# Patient Record
Sex: Female | Born: 1985 | Race: Black or African American | Hispanic: No | Marital: Single | Smoking: Never smoker
Health system: Southern US, Community
[De-identification: ages and names within clinical notes are randomized; demographics above are authoritative.]

## PROBLEM LIST (undated history)

## (undated) ENCOUNTER — Inpatient Hospital Stay (HOSPITAL_COMMUNITY): Payer: Self-pay

## (undated) DIAGNOSIS — I10 Essential (primary) hypertension: Secondary | ICD-10-CM

## (undated) DIAGNOSIS — N939 Abnormal uterine and vaginal bleeding, unspecified: Secondary | ICD-10-CM

## (undated) DIAGNOSIS — R51 Headache: Secondary | ICD-10-CM

## (undated) DIAGNOSIS — Z6841 Body Mass Index (BMI) 40.0 and over, adult: Secondary | ICD-10-CM

## (undated) DIAGNOSIS — J40 Bronchitis, not specified as acute or chronic: Secondary | ICD-10-CM

## (undated) HISTORY — PX: TONSILLECTOMY: SUR1361

## (undated) HISTORY — DX: Body Mass Index (BMI) 40.0 and over, adult: Z684

## (undated) HISTORY — PX: KNEE ARTHROSCOPY: SUR90

## (undated) HISTORY — PX: ELBOW SURGERY: SHX618

---
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
## 2006-04-29 ENCOUNTER — Emergency Department (HOSPITAL_COMMUNITY): Admission: EM | Admit: 2006-04-29 | Discharge: 2006-04-29 | Payer: Self-pay | Admitting: Emergency Medicine

## 2006-05-16 ENCOUNTER — Encounter: Admission: RE | Admit: 2006-05-16 | Discharge: 2006-06-22 | Payer: Self-pay | Admitting: Sports Medicine

## 2006-08-28 ENCOUNTER — Emergency Department (HOSPITAL_COMMUNITY): Admission: EM | Admit: 2006-08-28 | Discharge: 2006-08-28 | Payer: Self-pay | Admitting: Emergency Medicine

## 2007-07-15 ENCOUNTER — Emergency Department (HOSPITAL_COMMUNITY): Admission: EM | Admit: 2007-07-15 | Discharge: 2007-07-16 | Payer: Self-pay | Admitting: *Deleted

## 2008-03-25 ENCOUNTER — Emergency Department (HOSPITAL_COMMUNITY): Admission: EM | Admit: 2008-03-25 | Discharge: 2008-03-25 | Payer: Self-pay | Admitting: Emergency Medicine

## 2009-02-26 ENCOUNTER — Inpatient Hospital Stay (HOSPITAL_COMMUNITY): Admission: AD | Admit: 2009-02-26 | Discharge: 2009-02-26 | Payer: Self-pay | Admitting: Obstetrics and Gynecology

## 2009-03-26 ENCOUNTER — Ambulatory Visit: Payer: Self-pay | Admitting: Family Medicine

## 2009-03-26 ENCOUNTER — Inpatient Hospital Stay (HOSPITAL_COMMUNITY): Admission: AD | Admit: 2009-03-26 | Discharge: 2009-03-29 | Payer: Self-pay | Admitting: Family Medicine

## 2009-03-26 ENCOUNTER — Ambulatory Visit: Payer: Self-pay | Admitting: Family

## 2009-03-30 ENCOUNTER — Ambulatory Visit: Payer: Self-pay | Admitting: Family Medicine

## 2009-04-02 ENCOUNTER — Ambulatory Visit: Payer: Self-pay | Admitting: Family Medicine

## 2009-04-06 ENCOUNTER — Ambulatory Visit (HOSPITAL_COMMUNITY): Admission: RE | Admit: 2009-04-06 | Discharge: 2009-04-06 | Payer: Self-pay | Admitting: Obstetrics and Gynecology

## 2009-04-06 ENCOUNTER — Ambulatory Visit: Payer: Self-pay | Admitting: Obstetrics & Gynecology

## 2009-04-09 ENCOUNTER — Ambulatory Visit: Payer: Self-pay | Admitting: Family Medicine

## 2009-04-13 ENCOUNTER — Ambulatory Visit (HOSPITAL_COMMUNITY): Admission: RE | Admit: 2009-04-13 | Discharge: 2009-04-13 | Payer: Self-pay | Admitting: Obstetrics & Gynecology

## 2009-04-13 ENCOUNTER — Ambulatory Visit: Payer: Self-pay | Admitting: Obstetrics & Gynecology

## 2009-04-16 ENCOUNTER — Encounter: Payer: Self-pay | Admitting: Obstetrics & Gynecology

## 2009-04-16 ENCOUNTER — Ambulatory Visit: Payer: Self-pay | Admitting: Family Medicine

## 2009-04-20 ENCOUNTER — Ambulatory Visit: Payer: Self-pay | Admitting: Family Medicine

## 2009-04-20 ENCOUNTER — Ambulatory Visit (HOSPITAL_COMMUNITY): Admission: RE | Admit: 2009-04-20 | Discharge: 2009-04-20 | Payer: Self-pay | Admitting: Family Medicine

## 2009-04-23 ENCOUNTER — Ambulatory Visit (HOSPITAL_COMMUNITY): Admission: RE | Admit: 2009-04-23 | Discharge: 2009-04-23 | Payer: Self-pay | Admitting: Obstetrics & Gynecology

## 2009-04-23 ENCOUNTER — Ambulatory Visit: Payer: Self-pay | Admitting: Family Medicine

## 2009-04-27 ENCOUNTER — Ambulatory Visit (HOSPITAL_COMMUNITY): Admission: RE | Admit: 2009-04-27 | Discharge: 2009-04-27 | Payer: Self-pay | Admitting: Family Medicine

## 2009-04-27 ENCOUNTER — Ambulatory Visit: Payer: Self-pay | Admitting: Obstetrics & Gynecology

## 2009-04-30 ENCOUNTER — Inpatient Hospital Stay (HOSPITAL_COMMUNITY): Admission: AD | Admit: 2009-04-30 | Discharge: 2009-04-30 | Payer: Self-pay | Admitting: Obstetrics & Gynecology

## 2009-04-30 ENCOUNTER — Ambulatory Visit: Payer: Self-pay | Admitting: Obstetrics & Gynecology

## 2009-05-04 ENCOUNTER — Ambulatory Visit (HOSPITAL_COMMUNITY): Admission: RE | Admit: 2009-05-04 | Discharge: 2009-05-04 | Payer: Self-pay | Admitting: Obstetrics & Gynecology

## 2009-05-04 ENCOUNTER — Encounter: Payer: Self-pay | Admitting: Family Medicine

## 2009-05-04 ENCOUNTER — Ambulatory Visit: Payer: Self-pay | Admitting: Obstetrics & Gynecology

## 2009-05-07 ENCOUNTER — Ambulatory Visit: Payer: Self-pay | Admitting: Family Medicine

## 2009-05-08 ENCOUNTER — Inpatient Hospital Stay (HOSPITAL_COMMUNITY): Admission: AD | Admit: 2009-05-08 | Discharge: 2009-05-10 | Payer: Self-pay | Admitting: Obstetrics & Gynecology

## 2009-05-08 ENCOUNTER — Ambulatory Visit: Payer: Self-pay | Admitting: Family

## 2009-06-26 ENCOUNTER — Inpatient Hospital Stay (HOSPITAL_COMMUNITY): Admission: AD | Admit: 2009-06-26 | Discharge: 2009-06-27 | Payer: Self-pay | Admitting: Obstetrics & Gynecology

## 2009-07-21 ENCOUNTER — Emergency Department (HOSPITAL_COMMUNITY): Admission: EM | Admit: 2009-07-21 | Discharge: 2009-07-21 | Payer: Self-pay | Admitting: Emergency Medicine

## 2010-01-08 IMAGING — US US FETAL BPP W/O NONSTRESS
1 series · 14 of 21 positions shown · non-contrast
Comparison: none

OBSTETRICAL ULTRASOUND:
 This ultrasound exam was performed in the [HOSPITAL] Ultrasound Department.  The OB US report was generated in the AS system, and faxed to the ordering physician.  This report is also available in [REDACTED] PACS.

[Series 1: us fetal bpp w/o nonstress · non-contrast · 14 of 21 slices shown]
[im 1/21]
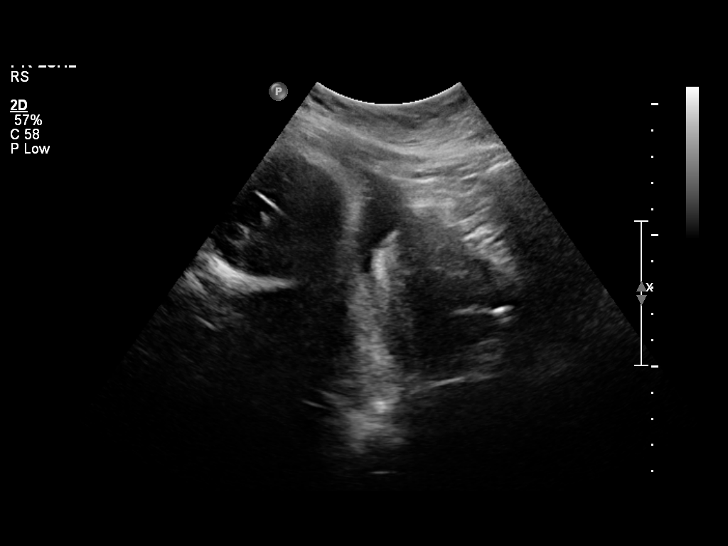
[im 3/21]
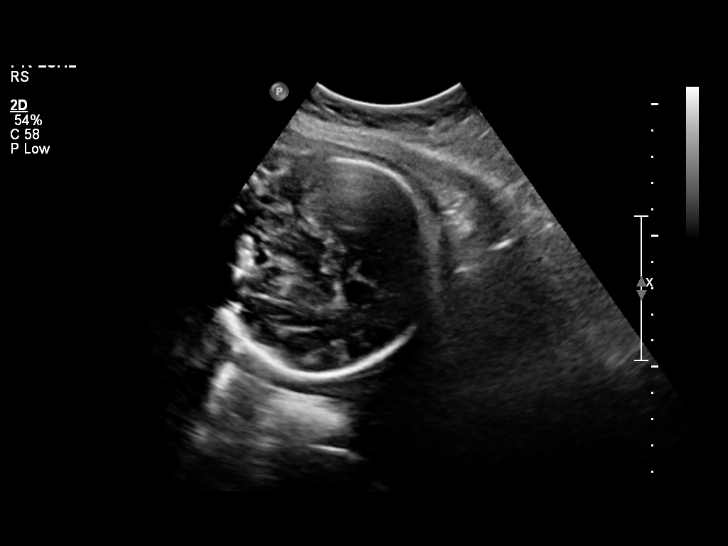
[im 4/21]
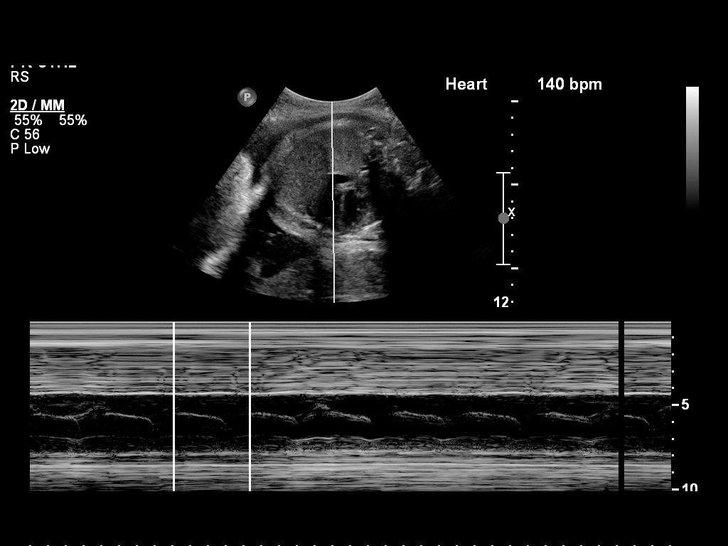
[im 6/21]
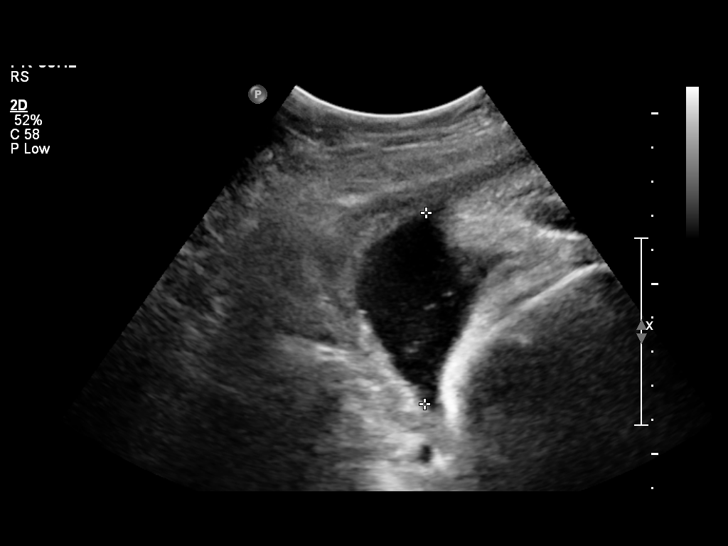
[im 7/21]
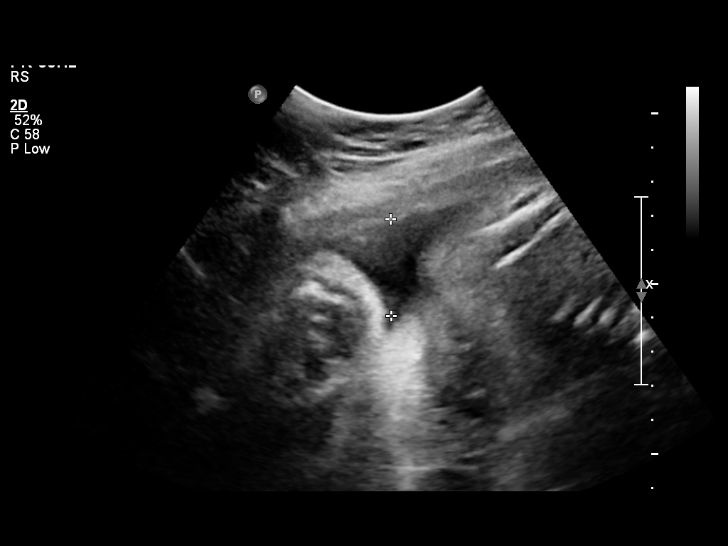
[im 9/21]
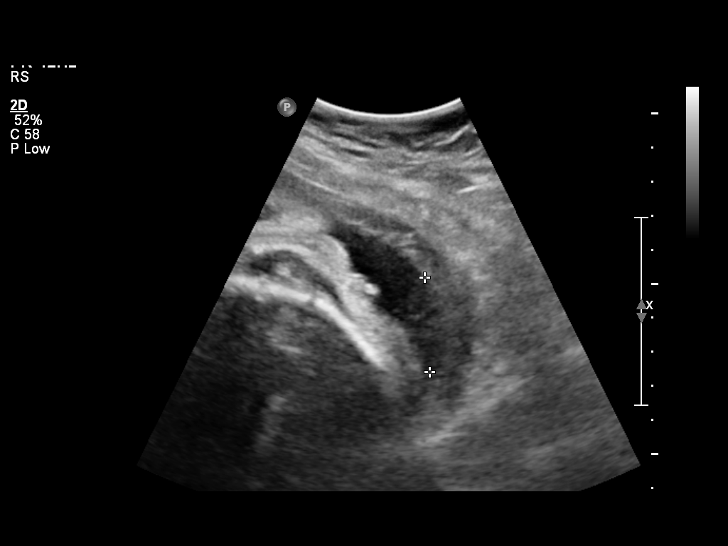
[im 10/21]
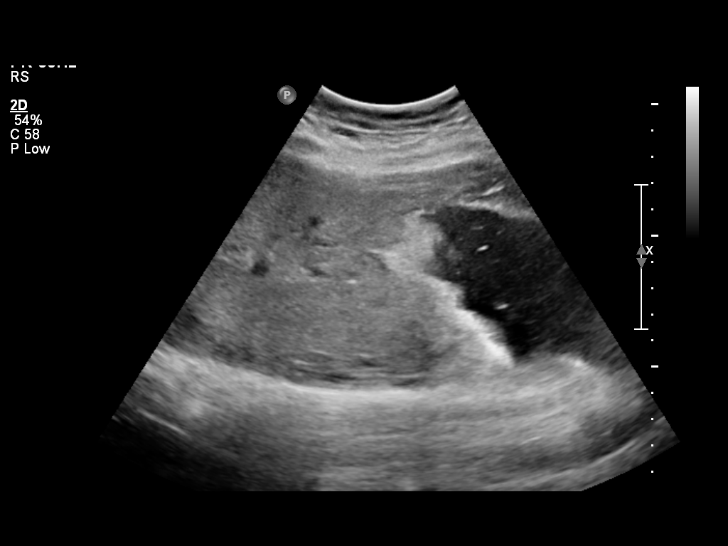
[im 12/21]
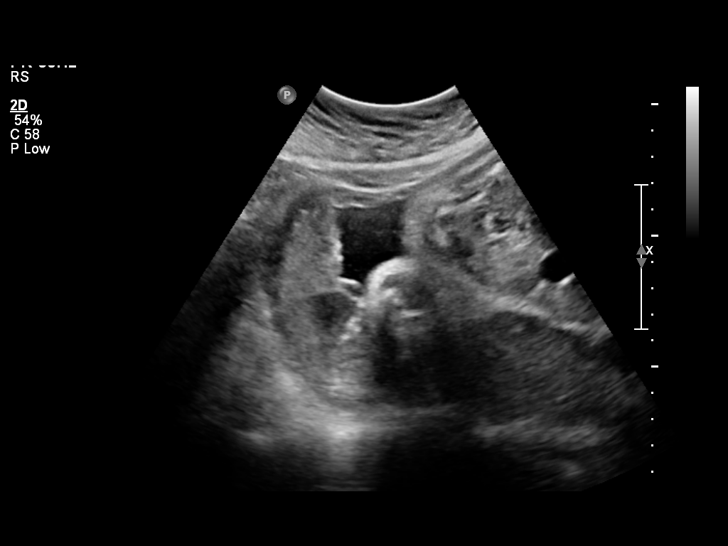
[im 13/21]
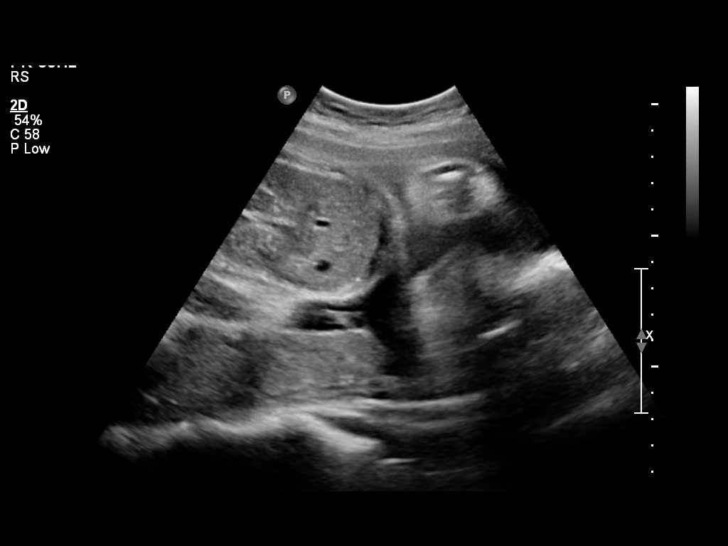
[im 15/21]
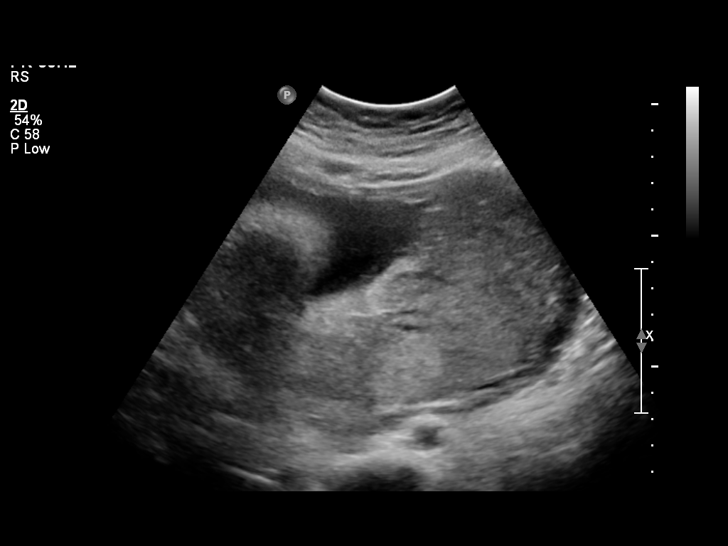
[im 16/21]
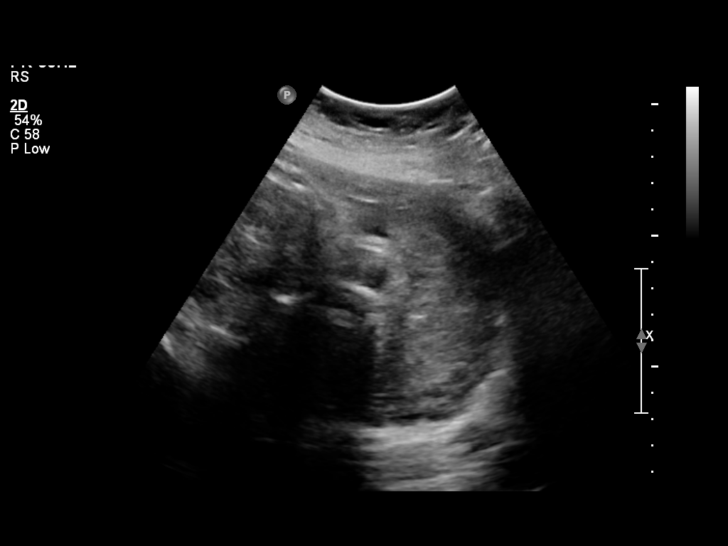
[im 18/21]
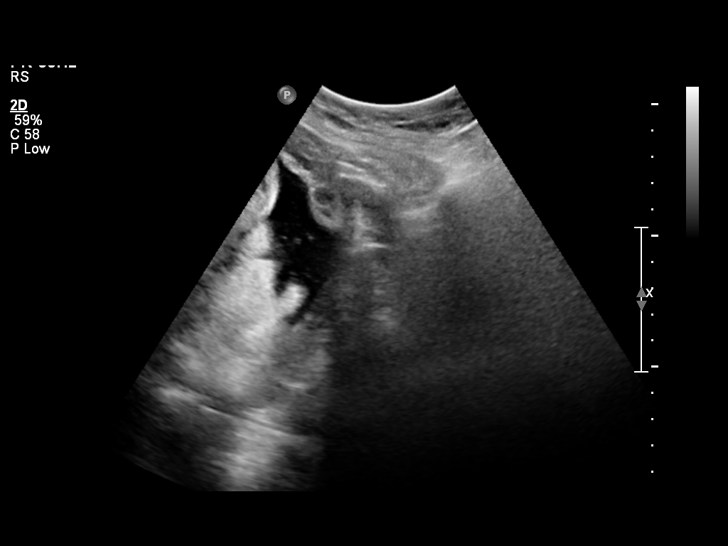
[im 19/21]
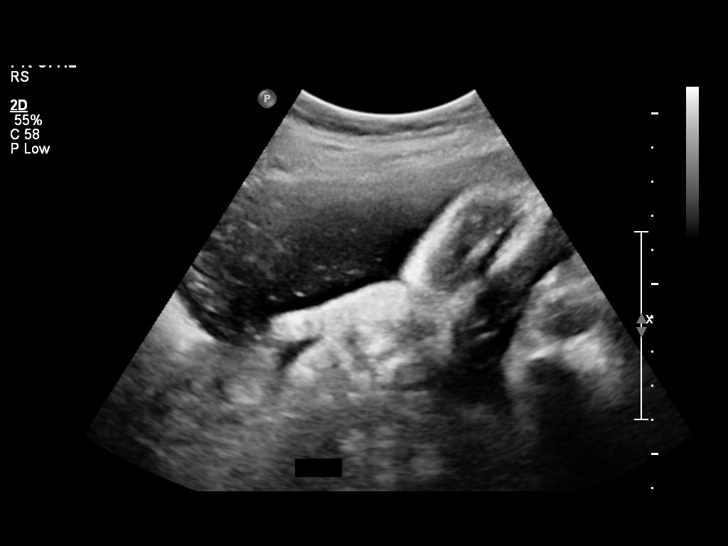
[im 21/21]
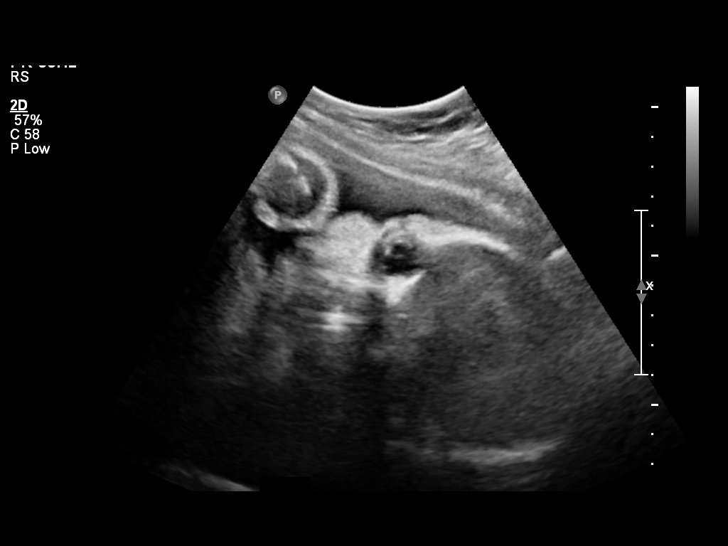

[14 of 21 positions shown; findings below may reference images not displayed]

IMPRESSION: See AS Obstetric US report.

## 2010-01-15 IMAGING — US US OB LIMITED
1 series · 14 of 18 positions shown · non-contrast
Comparison: none

OBSTETRICAL ULTRASOUND:
 This ultrasound exam was performed in the [HOSPITAL] Ultrasound Department.  The OB US report was generated in the AS system, and faxed to the ordering physician.  This report is also available in [REDACTED] PACS.

[Series 1: us fetal bpp w/o nonstress · non-contrast · 18 acquisitions, 14 frames shown]
[im 1/18]
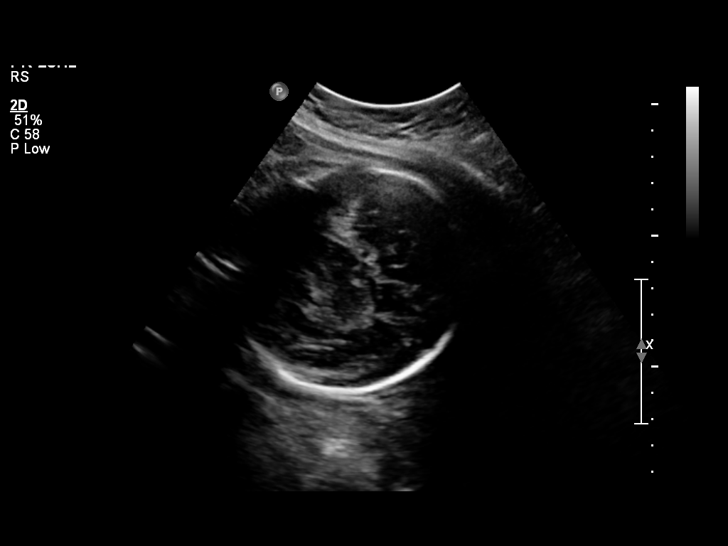
[im 2/18]
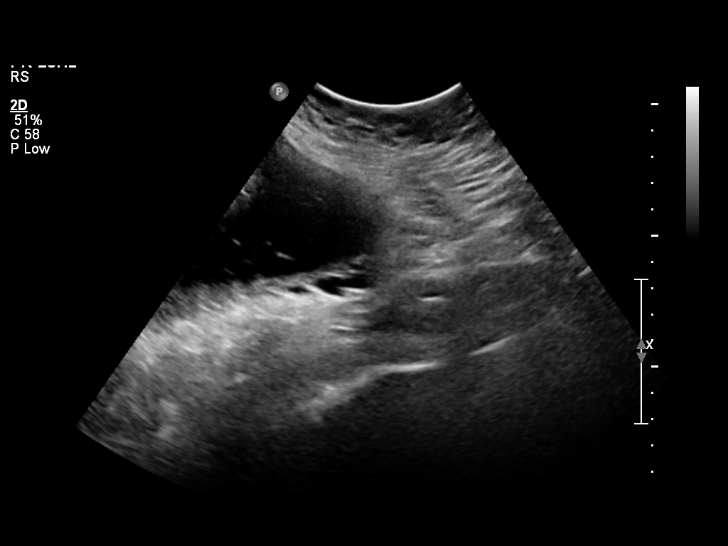
[im 4/18]
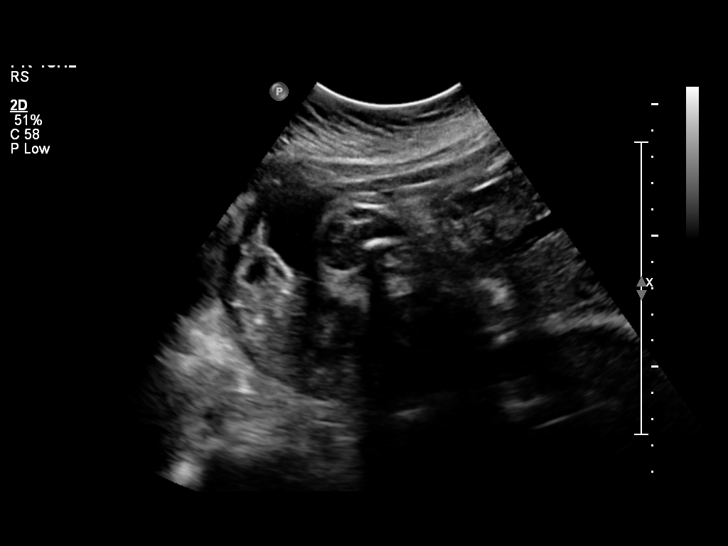
[im 5/18]
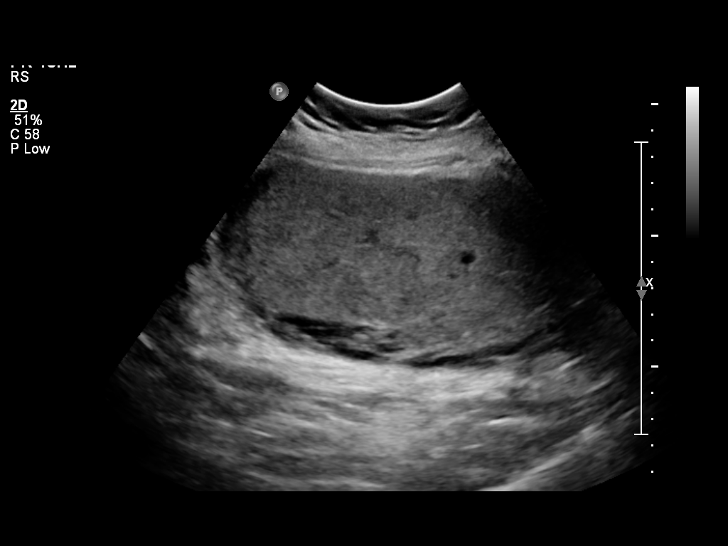
[im 6/18]
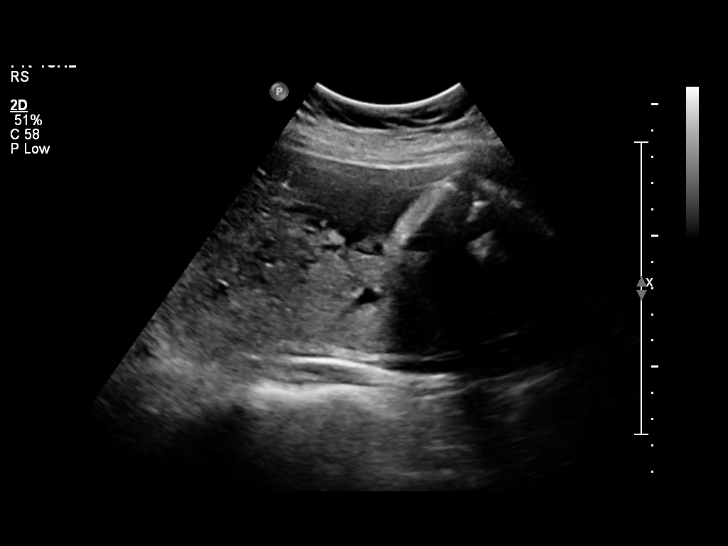
[im 8/18]
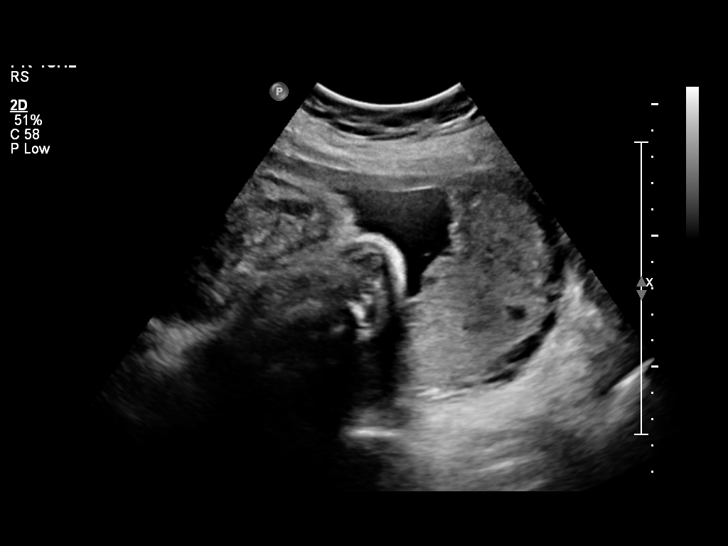
[im 9/18]
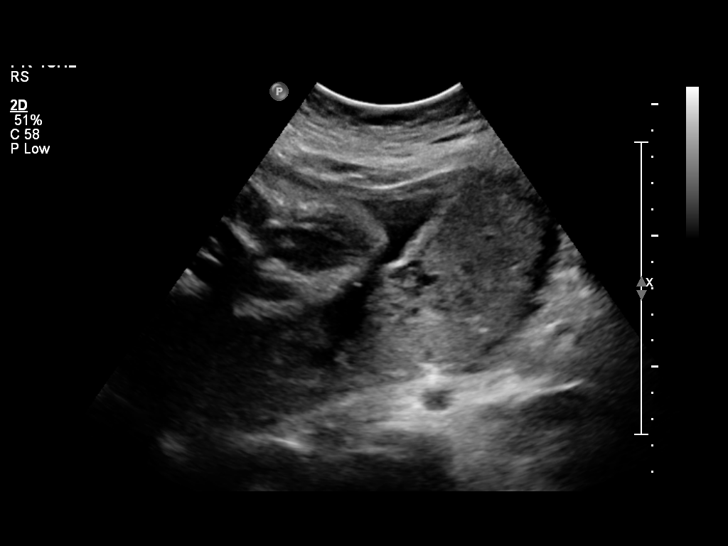
[im 10/18]
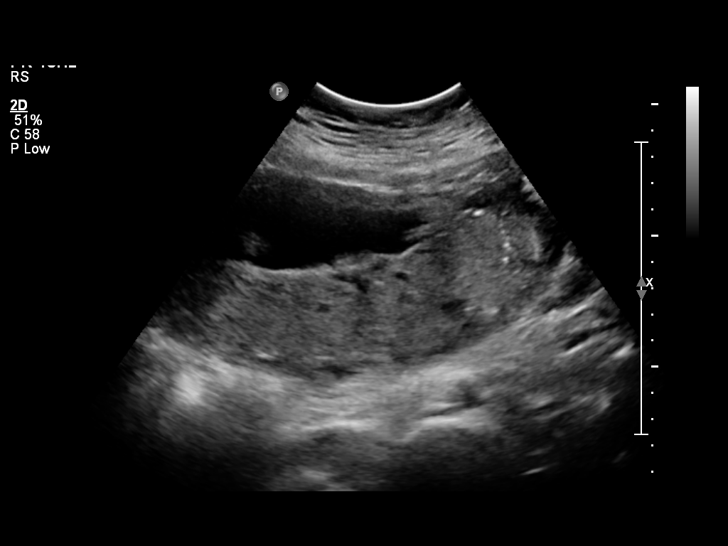
[im 11/18]
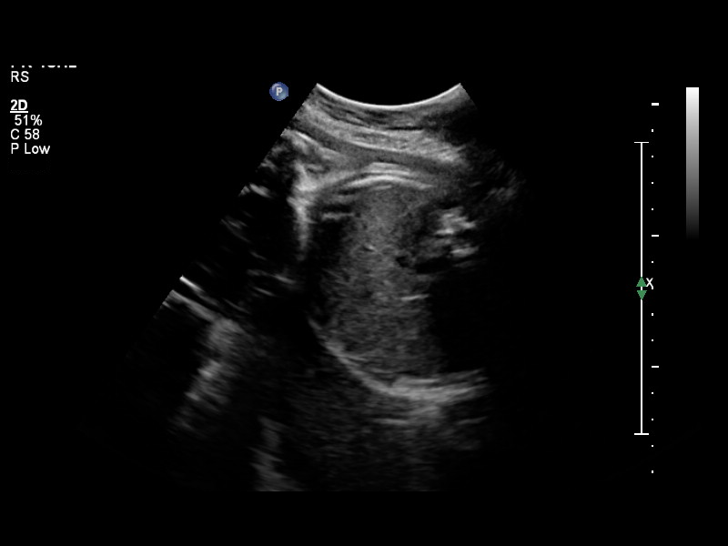
[im 13/18]
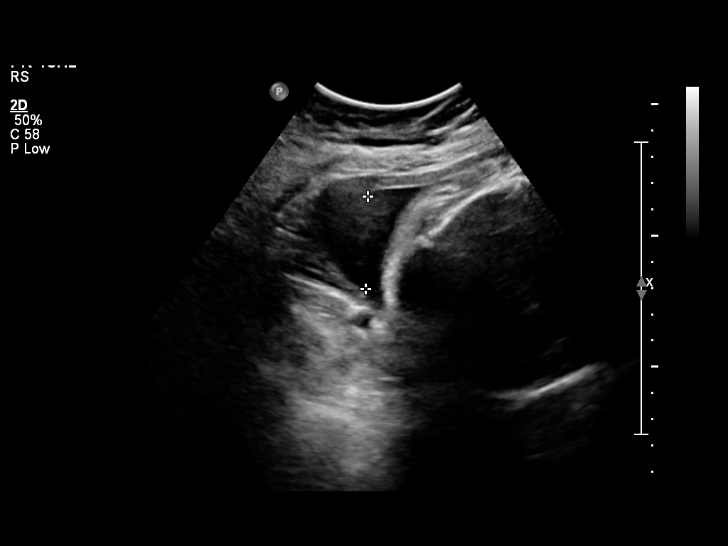
[im 14/18]
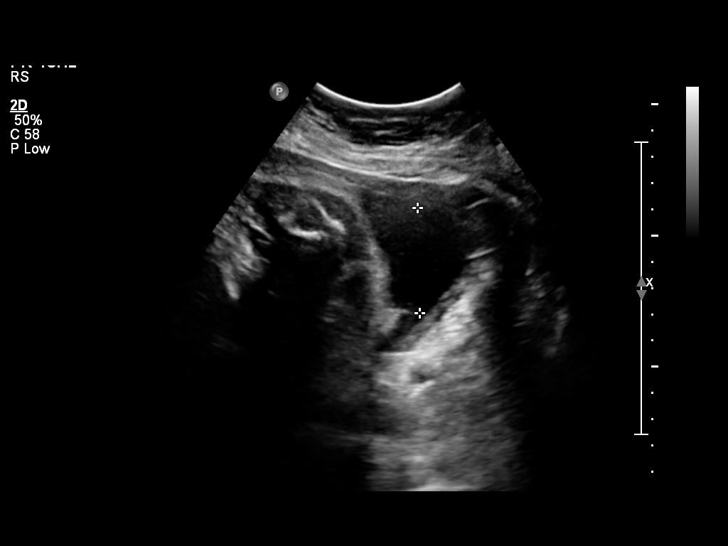
[im 15/18]
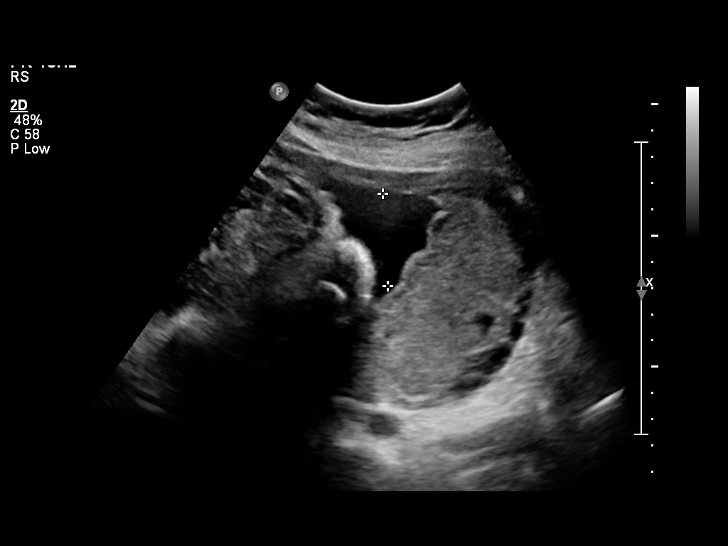
[im 17/18]
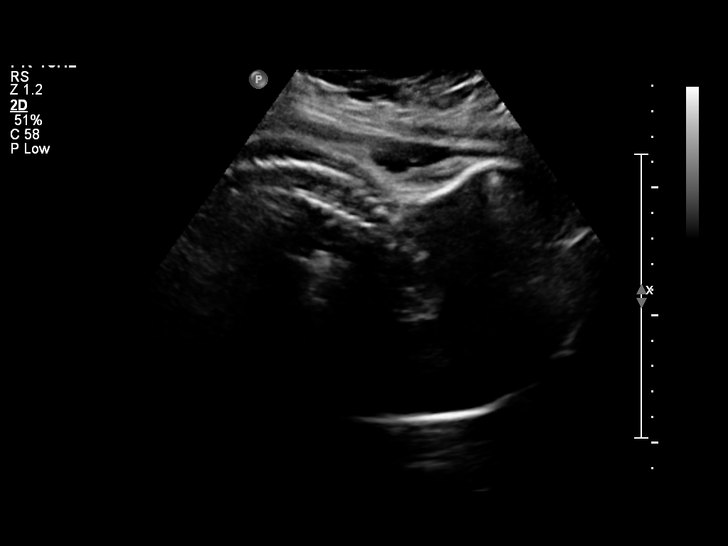
[im 18/18]
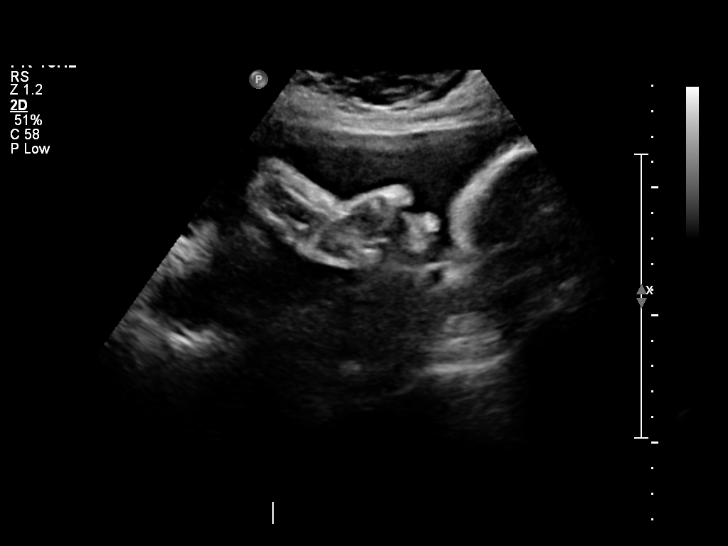

[14 of 18 positions shown; findings below may reference images not displayed]

IMPRESSION: See AS Obstetric US report.

## 2011-01-02 ENCOUNTER — Encounter: Payer: Self-pay | Admitting: *Deleted

## 2011-03-19 LAB — BASIC METABOLIC PANEL
Calcium: 9.5 mg/dL (ref 8.4–10.5)
GFR calc non Af Amer: >60 mL/min (ref >60)
Potassium: 3.9 mEq/L (ref 3.5–5.1)
Sodium: 137 mEq/L (ref 135–145)

## 2011-03-19 LAB — CBC
HCT: 37.8 % (ref 36.0–46.0)
Hemoglobin: 12.2 g/dL (ref 12.0–15.0)
Platelets: 194 10*3/uL (ref 150–400)
RDW: 13.9 % (ref 11.5–15.5)
WBC: 3.8 10*3/uL — ABNORMAL LOW (ref 4.0–10.5)

## 2011-03-19 LAB — URINE MICROSCOPIC-ADD ON

## 2011-03-19 LAB — URINALYSIS, ROUTINE W REFLEX MICROSCOPIC
Glucose, UA: NEGATIVE mg/dL
Hgb urine dipstick: NEGATIVE
Protein, ur: NEGATIVE mg/dL
Specific Gravity, Urine: 1.025 (ref 1.005–1.030)
Urobilinogen, UA: 0.2 mg/dL (ref 0.0–1.0)

## 2011-03-20 LAB — URINALYSIS, ROUTINE W REFLEX MICROSCOPIC
Bilirubin Urine: NEGATIVE
Glucose, UA: NEGATIVE mg/dL
Ketones, ur: 15 mg/dL — AB
Specific Gravity, Urine: >1.03 — ABNORMAL HIGH (ref 1.005–1.030)
pH: 5.5 (ref 5.0–8.0)

## 2011-03-20 LAB — COMPREHENSIVE METABOLIC PANEL
ALT: 12 U/L (ref 0–35)
AST: 16 U/L (ref 0–37)
Alkaline Phosphatase: 62 U/L (ref 39–117)
CO2: 25 mEq/L (ref 19–32)
Calcium: 9 mg/dL (ref 8.4–10.5)
Chloride: 106 mEq/L (ref 96–112)
GFR calc Af Amer: >60 mL/min (ref >60)
GFR calc non Af Amer: >60 mL/min (ref >60)
Glucose, Bld: 89 mg/dL (ref 70–99)
Potassium: 4 mEq/L (ref 3.5–5.1)
Sodium: 138 mEq/L (ref 135–145)

## 2011-03-20 LAB — CBC
Hemoglobin: 11.1 g/dL — ABNORMAL LOW (ref 12.0–15.0)
MCHC: 34.2 g/dL (ref 30.0–36.0)
RBC: 3.61 MIL/uL — ABNORMAL LOW (ref 3.87–5.11)
WBC: 2.5 10*3/uL — ABNORMAL LOW (ref 4.0–10.5)

## 2011-03-20 LAB — URINE MICROSCOPIC-ADD ON

## 2011-03-22 LAB — URINALYSIS, ROUTINE W REFLEX MICROSCOPIC
Bilirubin Urine: NEGATIVE
Glucose, UA: NEGATIVE mg/dL
Glucose, UA: NEGATIVE mg/dL
Hgb urine dipstick: NEGATIVE
Ketones, ur: NEGATIVE mg/dL
Ketones, ur: NEGATIVE mg/dL
Nitrite: NEGATIVE
Nitrite: NEGATIVE
Protein, ur: NEGATIVE mg/dL
Specific Gravity, Urine: 1.025 (ref 1.005–1.030)
Urobilinogen, UA: 1 mg/dL (ref 0.0–1.0)
pH: 6 (ref 5.0–8.0)
pH: 7 (ref 5.0–8.0)

## 2011-03-22 LAB — POCT URINALYSIS DIP (DEVICE)
Glucose, UA: NEGATIVE mg/dL
Glucose, UA: NEGATIVE mg/dL
Hgb urine dipstick: NEGATIVE
Ketones, ur: NEGATIVE mg/dL
Nitrite: NEGATIVE
Nitrite: NEGATIVE
Nitrite: NEGATIVE
Protein, ur: 30 mg/dL — AB
Protein, ur: 30 mg/dL — AB
Protein, ur: 30 mg/dL — AB
Specific Gravity, Urine: >=1.03 (ref 1.005–1.030)
Specific Gravity, Urine: >=1.03 (ref 1.005–1.030)
Specific Gravity, Urine: 1.025 (ref 1.005–1.030)
Urobilinogen, UA: 1 mg/dL (ref 0.0–1.0)
Urobilinogen, UA: 1 mg/dL (ref 0.0–1.0)
Urobilinogen, UA: 1 mg/dL (ref 0.0–1.0)
Urobilinogen, UA: 1 mg/dL (ref 0.0–1.0)

## 2011-03-22 LAB — CBC
HCT: 30 % — ABNORMAL LOW (ref 36.0–46.0)
Hemoglobin: 11.9 g/dL — ABNORMAL LOW (ref 12.0–15.0)
MCHC: 35 g/dL (ref 30.0–36.0)
MCV: 90.4 fL (ref 78.0–100.0)
Platelets: 175 10*3/uL (ref 150–400)
Platelets: 177 10*3/uL (ref 150–400)
RBC: 3.84 MIL/uL — ABNORMAL LOW (ref 3.87–5.11)
RDW: 13.7 % (ref 11.5–15.5)
RDW: 13.9 % (ref 11.5–15.5)
WBC: 11.9 10*3/uL — ABNORMAL HIGH (ref 4.0–10.5)
WBC: 6.3 10*3/uL (ref 4.0–10.5)

## 2011-03-22 LAB — URIC ACID: Uric Acid, Serum: 5.1 mg/dL (ref 2.4–7.0)

## 2011-03-22 LAB — COMPREHENSIVE METABOLIC PANEL
ALT: 21 U/L (ref 0–35)
AST: 17 U/L (ref 0–37)
AST: 19 U/L (ref 0–37)
Albumin: 2.8 g/dL — ABNORMAL LOW (ref 3.5–5.2)
Albumin: 3.2 g/dL — ABNORMAL LOW (ref 3.5–5.2)
Alkaline Phosphatase: 177 U/L — ABNORMAL HIGH (ref 39–117)
BUN: 13 mg/dL (ref 6–23)
Calcium: 8.6 mg/dL (ref 8.4–10.5)
Chloride: 103 mEq/L (ref 96–112)
Chloride: 105 mEq/L (ref 96–112)
Creatinine, Ser: 0.48 mg/dL (ref 0.4–1.2)
GFR calc Af Amer: >60 mL/min (ref >60)
Potassium: 3.5 mEq/L (ref 3.5–5.1)
Potassium: 4.2 mEq/L (ref 3.5–5.1)
Sodium: 131 mEq/L — ABNORMAL LOW (ref 135–145)
Sodium: 135 mEq/L (ref 135–145)
Total Bilirubin: 0.5 mg/dL (ref 0.3–1.2)
Total Bilirubin: 0.5 mg/dL (ref 0.3–1.2)
Total Protein: 6.2 g/dL (ref 6.0–8.3)
Total Protein: 6.6 g/dL (ref 6.0–8.3)

## 2011-03-23 LAB — COMPREHENSIVE METABOLIC PANEL
ALT: 19 U/L (ref 0–35)
ALT: 15 U/L (ref 0–35)
AST: 19 U/L (ref 0–37)
Albumin: 2.8 g/dL — ABNORMAL LOW (ref 3.5–5.2)
Alkaline Phosphatase: 110 U/L (ref 39–117)
Alkaline Phosphatase: 111 U/L (ref 39–117)
CO2: 22 mEq/L (ref 19–32)
Calcium: 8.7 mg/dL (ref 8.4–10.5)
Chloride: 100 mEq/L (ref 96–112)
GFR calc non Af Amer: >60 mL/min (ref >60)
Glucose, Bld: 76 mg/dL (ref 70–99)
Potassium: 3.9 mEq/L (ref 3.5–5.1)
Potassium: 3.4 mEq/L — ABNORMAL LOW (ref 3.5–5.1)
Sodium: 134 mEq/L — ABNORMAL LOW (ref 135–145)
Sodium: 130 mEq/L — ABNORMAL LOW (ref 135–145)
Total Bilirubin: 0.5 mg/dL (ref 0.3–1.2)
Total Protein: 5.7 g/dL — ABNORMAL LOW (ref 6.0–8.3)

## 2011-03-23 LAB — POCT URINALYSIS DIP (DEVICE)
Glucose, UA: NEGATIVE mg/dL
Hgb urine dipstick: NEGATIVE
Ketones, ur: NEGATIVE mg/dL
Nitrite: NEGATIVE
Protein, ur: 100 mg/dL — AB
Protein, ur: 30 mg/dL — AB
Specific Gravity, Urine: 1.02 (ref 1.005–1.030)
Specific Gravity, Urine: >=1.03 (ref 1.005–1.030)
Urobilinogen, UA: 2 mg/dL — ABNORMAL HIGH (ref 0.0–1.0)
pH: 6.5 (ref 5.0–8.0)
pH: 6 (ref 5.0–8.0)

## 2011-03-23 LAB — CBC
Hemoglobin: 11 g/dL — ABNORMAL LOW (ref 12.0–15.0)
Hemoglobin: 12.2 g/dL (ref 12.0–15.0)
MCHC: 34.9 g/dL (ref 30.0–36.0)
Platelets: 181 10*3/uL (ref 150–400)
RBC: 3.91 MIL/uL (ref 3.87–5.11)
RDW: 13.8 % (ref 11.5–15.5)
WBC: 6.4 10*3/uL (ref 4.0–10.5)

## 2011-03-23 LAB — STREP B DNA PROBE: Strep Group B Ag: NEGATIVE

## 2011-03-23 LAB — PROTEIN, URINE, 24 HOUR
Protein, 24H Urine: 130 mg/d — ABNORMAL HIGH (ref 50–100)
Protein, Urine: 4 mg/dL
Urine Total Volume-UPROT: 3250 mL

## 2011-03-23 LAB — LACTATE DEHYDROGENASE: LDH: 125 U/L (ref 94–250)

## 2011-03-23 LAB — CREATININE CLEARANCE, URINE, 24 HOUR
Collection Interval-CRCL: 24 hours
Creatinine Clearance: 250 mL/min — ABNORMAL HIGH (ref 75–115)

## 2011-03-24 LAB — CBC
HCT: 33 % — ABNORMAL LOW (ref 36.0–46.0)
Hemoglobin: 11.2 g/dL — ABNORMAL LOW (ref 12.0–15.0)
MCHC: 34 g/dL (ref 30.0–36.0)
RBC: 3.64 MIL/uL — ABNORMAL LOW (ref 3.87–5.11)
RDW: 13.2 % (ref 11.5–15.5)

## 2011-03-24 LAB — DIFFERENTIAL
Basophils Absolute: 0 10*3/uL (ref 0.0–0.1)
Eosinophils Relative: 1 % (ref 0–5)
Lymphocytes Relative: 22 % (ref 12–46)
Monocytes Absolute: 0.5 10*3/uL (ref 0.1–1.0)
Monocytes Relative: 7 % (ref 3–12)

## 2011-03-24 LAB — WET PREP, GENITAL

## 2011-03-24 LAB — GC/CHLAMYDIA PROBE AMP, GENITAL
Chlamydia, DNA Probe: NEGATIVE
GC Probe Amp, Genital: NEGATIVE

## 2011-03-24 LAB — URINALYSIS, ROUTINE W REFLEX MICROSCOPIC
Hgb urine dipstick: NEGATIVE
Ketones, ur: 40 mg/dL — AB
Protein, ur: NEGATIVE mg/dL
Urobilinogen, UA: 1 mg/dL (ref 0.0–1.0)

## 2011-03-24 LAB — RUBELLA SCREEN: Rubella: 310.9 IU/mL — ABNORMAL HIGH

## 2011-03-24 LAB — ABO/RH: ABO/RH(D): O POS

## 2011-03-24 LAB — RPR: RPR Ser Ql: NONREACTIVE

## 2011-04-26 NOTE — Discharge Summary (Signed)
Heidi Willis, Heidi Willis                ACCOUNT NO.:  [id number]   MEDICAL RECORD NO.:  [id number]          PATIENT TYPE:  INP   LOCATION:  9159                          FACILITY:  [facility]   PHYSICIAN:  Norton Blizzard, MD    DATE OF BIRTH:  Nov 05, 1986   DATE OF ADMISSION:  03/26/2009  DATE OF DISCHARGE:  03/29/2009                               DISCHARGE SUMMARY   REASON FOR ADMISSION:  Pregnancy at 25 weeks and 5 days with pregnancy-  induced hypertension versus preeclampsia.   HISTORY OF PRESENT ILLNESS:  Heidi Willis is a 25 year old gravida 1, para 0 at  25 and 5 who was admitted from faculty practice with an elevation in  blood pressure and some proteinuria on the dipstick urine.  A 24-hour  protein is 130 mg.  PIH and labs are within normal limits.  The patient  is responding well to Labetalol therapy and today, she is ready for  discharge.  Pressures are 140s/80s.  She is to follow up tomorrow in  [facility] and Thursday, also in [facility].  She has  appointments for those.   DISCHARGE MEDICATIONS:  Labetalol 200 mg p.o. b.i.d.  If she has any  problems or any questions, she is to call back and speak to whoever is  on call in regards to complaints or to come in if severe enough.      Zerita Boers, N.M.      Norton Blizzard, MD  Electronically Signed    DL/MEDQ  D:  04/54/0981  T:  03/30/2009  Job:  [id number]   cc:   Norton Blizzard, MD

## 2011-04-29 NOTE — Op Note (Signed)
[place]  Patient:    Heidi Willis, Heidi Willis                       MRN: [id number] Proc. Date: 04/12/00 Adm. Date:  [id number] Disc. Date: [id number] Attending:  Twana First                           Operative Report  PREOPERATIVE DIAGNOSIS:  Right elbow osteochondritis desiccans, capitellum/radial head.  POSTOPERATIVE DIAGNOSIS:  Right elbow osteochondritis desiccans, capitellum/radial head.  PROCEDURE: 1. Right elbow examination under anesthesia, followed by arthroscopic debridement    and partial synovectomy. 2. Right elbow osteochondritis desiccans drilling/capitellum/radial head.  SURGEON:  Elana Alm. Thurston Hole, M.D.  ASSISTANT:  Kirstin Adelberger, P.A.  ANESTHESIA:  General.  OPERATIVE TIME:  One hour.  COMPLICATIONS:  None.  INDICATION FOR PROCEDURE:  [name] is a 25 year old who has had three months of increasing right elbow pain, with signs and symptoms and MRI documenting osteochondritis desiccans with osteocartilaginous loose bodies, chondromalacia nd synovitis.  She is now to undergo arthroscopy.  DESCRIPTION OF PROCEDURE:  Brylin was brought to the operating room on Apr 12, 2000, placed under general anesthesia on a stretcher and then carefully turned on the  operating table in a prone position with all pressure points and genitalia well-padded.  Her right arm was examined under anesthesia.  She has full range f motion of her elbow and it was stable to ligamentous exam.  The anteromedial and anterolateral portals and posterior portal site were sterilely injected with 0.25% Marcaine with epinephrine.  Right arm was then prepped using sterile Betadine and draped using sterile technique.  Originally, through an anteromedial portal, staying anterior to the medial epicondyle, away from the ulnar nerve, the arthroscope with the pump attachment was placed in through an anterolateral portal, just anterior to the lateral  epicondyle, and arthroscopic probe was placed.  On  initial inspection, moderate synovitis in the anterior joint was debrided.  The  coronoid and the trochlea were found to be intact and normal.  The capitellum had significant grade 3 and mild grade 4 changes over 20% of it and grade 3 changes  over 50% of it, which were thoroughly debrided arthroscopically.  She also had  central grade 3 lesion in her radial head which was also debrided arthroscopically. The annular ligament was found to be intact.  Synovitis debrided.  No loose bodies were noted other than this cartilaginous fraying and fibrillation.  After this debridement was carried out, the anteromedial and anterolateral gutter areas were also inspected; they were found to be free of pathology other than mild synovitis. At this point then under fluoroscopic and arthroscopic control, a 0.45 K-wire was placed through the posterior capitellum and then retrograde, the capitellar osteochondritis lesion was drilled with multiple drill holes and, in fact, two o three of these drill holes were also used to drill into the radial head; this was done both visualizing this arthroscopically as well as fluoroscopically.  After  this was done, then two posterior portals were made, one in the central posterior and one posterolateral.  The olecranon fossa was visualized; a moderate synovitis was debrided but no loose bodies were noted.  The posterolateral and posteromedial gutter areas were also inspected but no loose bodies were noted.  After this was done, it was felt that all pathology had been satisfactorily addressed. Instruments were removed.  Portals were closed with  3-0 nylon suture and injected with 0.25% Marcaine with epinephrine, sterile dressings were applied, patient then turned prone on the stretcher, extubated, awakened and taken to the recovery room in stable condition.  FOLLOWUP CARE:  Alanii will be followed as an  outpatient on Vicodin and Naprosyn; see her back in the office in a week for sutures out and followup.DD:  04/12/00 TD:  04/14/00 Job: [id number]

## 2011-09-26 LAB — BASIC METABOLIC PANEL
BUN: 11
CO2: 27
Chloride: 108
GFR calc non Af Amer: >60
Glucose, Bld: 88
Potassium: 3.7
Sodium: 140

## 2011-09-26 LAB — CBC
HCT: 34.6 — ABNORMAL LOW
Hemoglobin: 11.7 — ABNORMAL LOW
MCHC: 33.8
MCV: 87.1
Platelets: 253
RDW: 13.7

## 2011-09-26 LAB — DIFFERENTIAL
Basophils Absolute: 0
Eosinophils Absolute: 0.1
Eosinophils Relative: 2
Lymphocytes Relative: 32
Monocytes Absolute: 0.4

## 2011-09-26 LAB — URINALYSIS, ROUTINE W REFLEX MICROSCOPIC
Bilirubin Urine: NEGATIVE
Ketones, ur: NEGATIVE
Nitrite: NEGATIVE
Protein, ur: NEGATIVE
Urobilinogen, UA: 0.2
pH: 5.5

## 2011-09-26 LAB — PREGNANCY, URINE: Preg Test, Ur: NEGATIVE

## 2011-09-26 LAB — URINE MICROSCOPIC-ADD ON

## 2011-11-28 ENCOUNTER — Emergency Department (HOSPITAL_COMMUNITY)
Admission: EM | Admit: 2011-11-28 | Discharge: 2011-11-28 | Disposition: A | Discharge status: Home / Self Care | Payer: Self-pay | Attending: Emergency Medicine | Admitting: Emergency Medicine

## 2011-11-28 ENCOUNTER — Encounter: Payer: Self-pay | Admitting: *Deleted

## 2011-11-28 ENCOUNTER — Emergency Department (HOSPITAL_COMMUNITY): Payer: Self-pay

## 2011-11-28 DIAGNOSIS — J3489 Other specified disorders of nose and nasal sinuses: Secondary | ICD-10-CM | POA: Insufficient documentation

## 2011-11-28 DIAGNOSIS — J40 Bronchitis, not specified as acute or chronic: Secondary | ICD-10-CM | POA: Insufficient documentation

## 2011-11-28 DIAGNOSIS — R059 Cough, unspecified: Secondary | ICD-10-CM | POA: Insufficient documentation

## 2011-11-28 DIAGNOSIS — R05 Cough: Secondary | ICD-10-CM | POA: Insufficient documentation

## 2011-11-28 MED ORDER — ALBUTEROL SULFATE (5 MG/ML) 0.5% IN NEBU
5.0000 mg | INHALATION_SOLUTION | Freq: Once | RESPIRATORY_TRACT | Status: AC
  Administered 2011-11-28: 5 mg via RESPIRATORY_TRACT
  Filled 2011-11-28: qty 1

## 2011-11-28 MED ORDER — ALBUTEROL SULFATE HFA 108 (90 BASE) MCG/ACT IN AERS
2.0000 | INHALATION_SPRAY | RESPIRATORY_TRACT | Status: DC | PRN
  Filled 2011-11-28: qty 6.7

## 2011-11-28 MED ORDER — BENZONATATE 100 MG PO CAPS: 100.0000 mg | ORAL_CAPSULE | Freq: Three times a day (TID) | ORAL | Status: AC | PRN

## 2011-11-28 MED ORDER — IPRATROPIUM BROMIDE 0.02 % IN SOLN
0.5000 mg | Freq: Once | RESPIRATORY_TRACT | Status: AC
  Administered 2011-11-28: 0.5 mg via RESPIRATORY_TRACT
  Filled 2011-11-28: qty 2.5

## 2011-11-28 MED ORDER — IBUPROFEN 800 MG PO TABS
800.0000 mg | ORAL_TABLET | Freq: Once | ORAL | Status: AC
  Administered 2011-11-28: 800 mg via ORAL
  Filled 2011-11-28: qty 1

## 2011-11-28 NOTE — ED Provider Notes (Signed)
Medical screening examination/treatment/procedure(s) were performed by non-physician practitioner and as supervising physician I was immediately available for consultation/collaboration.  Olivia Mackie, MD 11/28/11 [phone]

## 2011-11-28 NOTE — ED Provider Notes (Signed)
History     CSN: [id number] Arrival date & time: 11/28/2011 12:22 AM   First MD Initiated Contact with Patient 11/28/11 [phone]      Chief Complaint  Patient presents with  . Cough     HPI  History provided by the patient. Patient is 25 year old female who presents with complaints of persistent productive cough for the past 3 weeks. Symptoms began gradually and have been persistent. She has tried using over-the-counter Delsym cough and cold medicine as well as Alka-Seltzer sinus and cold medicine without any improvements. Sputum is a yellow color. She reports some nasal congestion and rhinorrhea. Patient denies any alleviating factors. Patient works at [employer] and does come across sick customers. Patient denies any fevers, chills, sweats, nausea, vomiting, chest pain. Patient is a nonsmoker, no recent travel or prolonged sitting, no hemoptysis, no swelling of extremities, no recent surgery, history estrogen use or birth control, a prior history of DVT or PE, no history of cancer. Patient has no other significant past medical history.    History reviewed. No pertinent past medical history.  History reviewed. No pertinent past surgical history.  No family history on file.  History  Substance Use Topics  . Smoking status: Not on file  . Smokeless tobacco: Not on file  . Alcohol Use: Not on file    OB History    Grav Para Term Preterm Abortions TAB SAB Ect Mult Living                  Review of Systems  Constitutional: Negative for fever, chills, appetite change and fatigue.  HENT: Positive for congestion and rhinorrhea. Negative for sore throat.   Respiratory: Positive for cough and wheezing. Negative for shortness of breath.   Cardiovascular: Negative for chest pain and palpitations.  Gastrointestinal: Negative for nausea, vomiting, abdominal pain, diarrhea and constipation.  Musculoskeletal: Negative for myalgias.  Neurological: Negative for light-headedness and headaches.   All other systems reviewed and are negative.    Allergies  Review of patient's allergies indicates no known allergies.  Home Medications  No current outpatient prescriptions on file.  BP 145/94  Pulse 90  Temp(Src) 98.8 F (37.1 C) (Oral)  Resp 20  SpO2 100%  Physical Exam  Nursing note and vitals reviewed. Constitutional: She is oriented to person, place, and time. She appears well-developed and well-nourished. No distress.  HENT:  Head: Normocephalic.  Right Ear: External ear normal.  Left Ear: External ear normal.  Mouth/Throat: Oropharynx is clear and moist.  Eyes: Conjunctivae are normal. Pupils are equal, round, and reactive to light.  Neck: Normal range of motion.  Cardiovascular: Normal rate, regular rhythm and normal heart sounds.   No murmur heard. Pulmonary/Chest: Effort normal and breath sounds normal. No respiratory distress. She has no wheezes. She has no rales.  Abdominal: Soft. She exhibits no distension. There is no tenderness. There is no rebound and no guarding.  Musculoskeletal: She exhibits no edema and no tenderness.  Neurological: She is alert and oriented to person, place, and time.  Skin: Skin is warm. No rash noted.  Psychiatric: She has a normal mood and affect.    ED Course  Procedures (including critical care time)  Dg Chest 2 View  11/28/2011  *RADIOLOGY REPORT*  Clinical Data: Cough  CHEST - 2 VIEW  Comparison: 04/29/2006  Findings: Lungs are clear. No pleural effusion or pneumothorax.  The heart is top normal in size.  Visualized osseous structures are within normal limits.  IMPRESSION: No  evidence of acute cardiopulmonary disease.  Original Report Authenticated By: Charline Bills, M.D.     1. Bronchitis       MDM  12:30 AM patient seen and evaluated. Patient no acute distress. Patient with no difficulty breathing at this time. Patient has normal respirations and O2 sats. Patient is PERC negative        Angus Seller,  [place] 11/28/11 [phone]

## 2011-11-28 NOTE — ED Notes (Signed)
Pt reports porductive cough with yellowish sputum x 3 weeks.

## 2011-11-28 NOTE — ED Notes (Signed)
Patient transported to X-ray 

## 2012-02-12 ENCOUNTER — Encounter (HOSPITAL_COMMUNITY): Payer: Self-pay

## 2012-02-12 ENCOUNTER — Emergency Department (HOSPITAL_COMMUNITY)
Admission: EM | Admit: 2012-02-12 | Discharge: 2012-02-12 | Disposition: A | Discharge status: Home Health | Payer: Self-pay | Source: Home / Self Care | Attending: Emergency Medicine | Admitting: Emergency Medicine

## 2012-02-12 DIAGNOSIS — R6889 Other general symptoms and signs: Secondary | ICD-10-CM

## 2012-02-12 DIAGNOSIS — J111 Influenza due to unidentified influenza virus with other respiratory manifestations: Secondary | ICD-10-CM

## 2012-02-12 HISTORY — DX: Essential (primary) hypertension: I10

## 2012-02-12 HISTORY — DX: Bronchitis, not specified as acute or chronic: J40

## 2012-02-12 LAB — POCT RAPID STREP A: Streptococcus, Group A Screen (Direct): NEGATIVE

## 2012-02-12 MED ORDER — GUAIFENESIN-CODEINE 100-10 MG/5ML PO SYRP: 5.0000 mL | ORAL_SOLUTION | Freq: Four times a day (QID) | ORAL | Status: AC | PRN

## 2012-02-12 MED ORDER — IBUPROFEN 600 MG PO TABS: 600.0000 mg | ORAL_TABLET | Freq: Four times a day (QID) | ORAL | Status: AC | PRN

## 2012-02-12 NOTE — ED Notes (Signed)
Pt c/o chills/fever, nausea, sore throat, sob, congestion which started on thurs.

## 2012-02-12 NOTE — ED Provider Notes (Signed)
History     CSN: [id number]  Arrival date & time 02/12/12  1616   First MD Initiated Contact with Patient 02/12/12 1632      Chief Complaint  Patient presents with  . Influenza    congestion, chills/fever, sore throat, nausea  and sob    (Consider location/radiation/quality/duration/timing/severity/associated sxs/prior treatment) HPI Comments: HPI : Flu symptoms for about 4 day. Fever to 102 with chills, myalgias for the first few days. Now complaining of sore throat, nasal congestion, fatigue, headache, nonproductive cough, myalgias, shortness of breath.. Has decreased appetite, but tolerating some liquids by mouth. Patient's cousin with similar symptoms.  Review of Systems: Positive for fatigue, mild nasal congestion, mild sore throat, mild swollen anterior neck glands, mild cough. Negative for acute vision changes, stiff neck, focal weakness, syncope, seizures, respiratory distress, vomiting, diarrhea, GU symptoms.   Patient is a 26 y.o. female presenting with flu symptoms. The history is provided by the patient.  Influenza This is a new problem. The current episode started more than 2 days ago. The problem occurs constantly. The problem has not changed since onset.Associated symptoms include headaches and shortness of breath. Pertinent negatives include no chest pain and no abdominal pain. The symptoms are aggravated by nothing. The symptoms are relieved by nothing. Treatments tried: OTC cold meds. The treatment provided mild relief.    Past Medical History  Diagnosis Date  . Hypertension   . Bronchitis     Past Surgical History  Procedure Date  . Elbow surgery     History reviewed. No pertinent family history.  History  Substance Use Topics  . Smoking status: Never Smoker   . Smokeless tobacco: Not on file  . Alcohol Use: No    OB History    Grav Para Term Preterm Abortions TAB SAB Ect Mult Living                  Review of Systems  Respiratory: Positive  for shortness of breath.   Cardiovascular: Negative for chest pain.  Gastrointestinal: Negative for abdominal pain.  Neurological: Positive for headaches.    Allergies  Review of patient's allergies indicates no known allergies.  Home Medications   Current Outpatient Rx  Name Route Sig Dispense Refill  . DEXTROMETHORPHAN POLISTIREX ER 30 MG/5ML PO LQCR Oral Take 60 mg by mouth as needed. Cough     . GUAIFENESIN-CODEINE 100-10 MG/5ML PO SYRP Oral Take 5 mLs by mouth 4 (four) times daily as needed for cough. 120 mL 0  . IBUPROFEN 600 MG PO TABS Oral Take 1 tablet (600 mg total) by mouth every 6 (six) hours as needed for pain. 30 tablet 0    BP 154/84  Pulse 96  Temp(Src) 98.8 F (37.1 C) (Oral)  Resp 18  SpO2 96%  LMP 02/08/2012  Physical Exam  Nursing note and vitals reviewed. Constitutional: She is oriented to person, place, and time. She appears well-developed and well-nourished.  HENT:  Head: Normocephalic and atraumatic.  Right Ear: Tympanic membrane and ear canal normal.  Left Ear: Tympanic membrane and ear canal normal.  Nose: Mucosal edema and rhinorrhea present. No epistaxis.  Mouth/Throat: Uvula is midline and mucous membranes are normal. Posterior oropharyngeal erythema present. No oropharyngeal exudate.       Tonsillar swelling, left more than right. Exudates left side. (-) frontal, maxillary sinus tenderness  Eyes: Conjunctivae and EOM are normal. Pupils are equal, round, and reactive to light.  Neck: Normal range of motion. Neck supple.  Cardiovascular:  Normal rate, regular rhythm, normal heart sounds and intact distal pulses.   Pulmonary/Chest: Effort normal and breath sounds normal.  Abdominal: Soft. Bowel sounds are normal. She exhibits no distension.  Musculoskeletal: Normal range of motion.  Lymphadenopathy:    She has no cervical adenopathy.  Neurological: She is alert and oriented to person, place, and time.  Skin: Skin is warm and dry. No rash noted.    Psychiatric: She has a normal mood and affect. Her behavior is normal. Judgment and thought content normal.    ED Course  Procedures (including critical care time)   Labs Reviewed  POCT RAPID STREP A ([facility]RG CARE ONLY)   No results found.   1. Flu-like symptoms    Results for orders placed during the hospital encounter of 02/12/12  POCT RAPID STREP A ([facility] CARE ONLY)      Component Value Range   Streptococcus, Group A Screen (Direct) NEGATIVE  NEGATIVE       MDM  Patient's age 25 hour window of Tamiflu. Afebrile here. BP noted. Patient asymptomatic. Discussed importance of finding primary care physician for ongoing management of hypertension.  Luiz Blare, MD 02/12/12 [phone]

## 2013-06-24 LAB — OB RESULTS CONSOLE GC/CHLAMYDIA
Chlamydia: NEGATIVE
Gonorrhea: NEGATIVE

## 2013-06-24 LAB — OB RESULTS CONSOLE HIV ANTIBODY (ROUTINE TESTING): HIV: NONREACTIVE

## 2013-06-24 LAB — OB RESULTS CONSOLE ANTIBODY SCREEN: ANTIBODY SCREEN: NEGATIVE

## 2013-06-24 LAB — OB RESULTS CONSOLE RPR: RPR: NONREACTIVE

## 2013-12-15 ENCOUNTER — Encounter (HOSPITAL_COMMUNITY): Payer: Self-pay

## 2013-12-15 ENCOUNTER — Inpatient Hospital Stay (HOSPITAL_COMMUNITY)
Admission: AD | Admit: 2013-12-15 | Discharge: 2013-12-15 | Disposition: A | Discharge status: Home / Self Care | Payer: BC Managed Care – PPO | Source: Ambulatory Visit | Attending: Obstetrics & Gynecology | Admitting: Obstetrics & Gynecology

## 2013-12-15 DIAGNOSIS — R1011 Right upper quadrant pain: Secondary | ICD-10-CM

## 2013-12-15 DIAGNOSIS — O9989 Other specified diseases and conditions complicating pregnancy, childbirth and the puerperium: Principal | ICD-10-CM

## 2013-12-15 DIAGNOSIS — R109 Unspecified abdominal pain: Secondary | ICD-10-CM | POA: Diagnosis present

## 2013-12-15 DIAGNOSIS — O99891 Other specified diseases and conditions complicating pregnancy: Secondary | ICD-10-CM | POA: Diagnosis not present

## 2013-12-15 LAB — CBC
HCT: 31.9 % — ABNORMAL LOW (ref 36.0–46.0)
HEMOGLOBIN: 11.2 g/dL — AB (ref 12.0–15.0)
MCH: 28.3 pg (ref 26.0–34.0)
MCHC: 35.1 g/dL (ref 30.0–36.0)
MCV: 80.6 fL (ref 78.0–100.0)
Platelets: 236 10*3/uL (ref 150–400)
RBC: 3.96 MIL/uL (ref 3.87–5.11)
RDW: 13.8 % (ref 11.5–15.5)
WBC: 6.5 10*3/uL (ref 4.0–10.5)

## 2013-12-15 LAB — COMPREHENSIVE METABOLIC PANEL
ALT: 12 U/L (ref 0–35)
AST: 11 U/L (ref 0–37)
Albumin: 2.6 g/dL — ABNORMAL LOW (ref 3.5–5.2)
Alkaline Phosphatase: 148 U/L — ABNORMAL HIGH (ref 39–117)
BUN: 11 mg/dL (ref 6–23)
CALCIUM: 9 mg/dL (ref 8.4–10.5)
CO2: 23 meq/L (ref 19–32)
Chloride: 102 mEq/L (ref 96–112)
Creatinine, Ser: 0.48 mg/dL — ABNORMAL LOW (ref 0.50–1.10)
GFR calc Af Amer: >90 mL/min (ref >90)
Glucose, Bld: 74 mg/dL (ref 70–99)
Potassium: 4 mEq/L (ref 3.7–5.3)
Sodium: 137 mEq/L (ref 137–147)
Total Bilirubin: 0.3 mg/dL (ref 0.3–1.2)
Total Protein: 6.5 g/dL (ref 6.0–8.3)

## 2013-12-15 LAB — URINALYSIS, ROUTINE W REFLEX MICROSCOPIC
BILIRUBIN URINE: NEGATIVE
Glucose, UA: NEGATIVE mg/dL
HGB URINE DIPSTICK: NEGATIVE
KETONES UR: NEGATIVE mg/dL
Leukocytes, UA: NEGATIVE
Nitrite: NEGATIVE
PH: 6.5 (ref 5.0–8.0)
Protein, ur: NEGATIVE mg/dL
SPECIFIC GRAVITY, URINE: 1.025 (ref 1.005–1.030)
Urobilinogen, UA: 1 mg/dL (ref 0.0–1.0)

## 2013-12-15 LAB — AMYLASE: Amylase: 38 U/L (ref 0–105)

## 2013-12-15 LAB — LIPASE, BLOOD: LIPASE: 12 U/L (ref 11–59)

## 2013-12-15 MED ORDER — GI COCKTAIL ~~LOC~~
30.0000 mL | Freq: Once | ORAL | Status: AC
  Administered 2013-12-15: 30 mL via ORAL
  Filled 2013-12-15: qty 30

## 2013-12-15 NOTE — Discharge Instructions (Signed)
Abdominal Pain During Pregnancy °Abdominal discomfort is common in pregnancy. Most of the time, it does not cause harm. There are many causes of abdominal pain. Some causes are more serious than others. Some of the causes of abdominal pain in pregnancy are easily diagnosed. Occasionally, the diagnosis takes time to understand. Other times, the cause is not determined. Abdominal pain can be a sign that something is very wrong with the pregnancy, or the pain may have nothing to do with the pregnancy at all. For this reason, always tell your caregiver if you have any abdominal discomfort. °CAUSES °Common and harmless causes of abdominal pain include: °· Constipation. °· Excess gas and bloating. °· Round ligament pain. This is pain that is felt in the folds of the groin. °· The position the baby or placenta is in. °· Baby kicks. °· Braxton-Hicks contractions. These are mild contractions that do not cause cervical dilation. °Serious causes of abdominal pain include: °· Ectopic pregnancy. This happens when a fertilized egg implants outside of the uterus. °· Miscarriage. °· Preterm labor. This is when labor starts at less than 37 weeks of pregnancy. °· Placental abruption. This is when the placenta partially or completely separates from the uterus. °· Preeclampsia. This is often associated with high blood pressure and has been referred to as "toxemia in pregnancy." °· Uterine or amniotic fluid infections.  °Causes unrelated to pregnancy include: °· Urinary tract infection. °· Gallbladder stones or inflammation. °· Hepatitis or other liver illness. °· Intestinal problems, stomach flu, food poisoning, or ulcer. °· Appendicitis. °· Kidney (renal) stones. °· Kidney infection (pylonephritis). °HOME CARE INSTRUCTIONS  °For mild pain: °· Do not have sexual intercourse or put anything in your vagina until your symptoms go away completely. °· Get plenty of rest until your pain improves. If your pain does not improve in 1 hour, call  your caregiver. °· Drink clear fluids if you feel nauseous. Avoid solid food as long as you are uncomfortable or nauseous. °· Only take medicine as directed by your caregiver. °· Keep all follow-up appointments with your caregiver. °SEEK IMMEDIATE MEDICAL CARE IF: °· You are bleeding, leaking fluid, or passing tissue from the vagina. °· You have increasing pain or cramping. °· You have persistent vomiting. °· You have painful or bloody urination. °· You have a fever. °· You notice a decrease in your baby's movements. °· You have extreme weakness or feel faint. °· You have shortness of breath, with or without abdominal pain. °· You develop a severe headache with abdominal pain. °· You have abnormal vaginal discharge with abdominal pain. °· You have persistent diarrhea. °· You have abdominal pain that continues even after rest, or gets worse. °MAKE SURE YOU:  °· Understand these instructions. °· Will watch your condition. °· Will get help right away if you are not doing well or get worse. °Document Released: 11/28/2005 Document Revised: 02/20/2012 Document Reviewed: 06/27/2013 °Exit[employer]® Patient Information ©2014 [employer]. ° °

## 2013-12-15 NOTE — MAU Note (Signed)
Pt c/o mid abdominal pain since this morning. Denies n/v/d. Denies LOF, vag bleeding or d/c. +FM.

## 2013-12-15 NOTE — MAU Provider Note (Signed)
History     CSN: [id number]  Arrival date and time: 12/15/13 2102   First Provider Initiated Contact with Patient 12/15/13 2205      Chief Complaint  Patient presents with  . Abdominal Pain   HPI  Heidi Willis is a 28 y.o. G2P1001 at [redacted]w[redacted]d who presents today with abdominal pain. She states that the pain is around the umbilicus, and she took some zantac earlier, but it did not help with the pain. The pain is slightly more on the right than the left. She states that she has high blood pressure and she is on methyldopa. She denies HA, visual disturbances, contractions or LOF. She confirms fetal movement at the time of presentation.   Past Medical History  Diagnosis Date  . Hypertension   . Bronchitis     Past Surgical History  Procedure Laterality Date  . Elbow surgery      History reviewed. No pertinent family history.  History  Substance Use Topics  . Smoking status: Never Smoker   . Smokeless tobacco: Not on file  . Alcohol Use: No    Allergies: No Known Allergies  Prescriptions prior to admission  Medication Sig Dispense Refill  . ferrous sulfate 325 (65 FE) MG tablet Take 325 mg by mouth daily with breakfast.      . methyldopa (ALDOMET) 250 MG tablet Take 250 mg by mouth 2 (two) times daily.      . Prenatal Vit-Fe Fumarate-FA (MULTIVITAMIN-PRENATAL) 27-0.8 MG TABS tablet Take 1 tablet by mouth daily at 12 noon.      . ranitidine (ZANTAC) 150 MG tablet Take 150 mg by mouth 2 (two) times daily.        ROS Physical Exam   Blood pressure 141/82, pulse 80, temperature 97.6 F (36.4 C), temperature source Oral, resp. rate 18.  Physical Exam  Nursing note and vitals reviewed. Constitutional: She is oriented to person, place, and time. She appears well-developed and well-nourished. No distress.  Cardiovascular: Normal rate.   Respiratory: Effort normal.  GI: Soft. Tenderness: slightly tender on the RUQ   Genitourinary:  Cervix: FT/thick/ballotable.    Neurological: She is alert and oriented to person, place, and time.  Skin: Skin is warm and dry.  Psychiatric: She has a normal mood and affect.  NST: 140, moderate with 15x15 accels, no decels Toco: no UCs   MAU Course  Procedures  Results for orders placed during the hospital encounter of 12/15/13 (from the past 24 hour(s))  URINALYSIS, ROUTINE W REFLEX MICROSCOPIC     Status: None   Collection Time    12/15/13  9:35 PM      Result Value Range   Color, Urine YELLOW  YELLOW   APPearance CLEAR  CLEAR   Specific Gravity, Urine 1.025  1.005 - 1.030   pH 6.5  5.0 - 8.0   Glucose, UA NEGATIVE  NEGATIVE mg/dL   Hgb urine dipstick NEGATIVE  NEGATIVE   Bilirubin Urine NEGATIVE  NEGATIVE   Ketones, ur NEGATIVE  NEGATIVE mg/dL   Protein, ur NEGATIVE  NEGATIVE mg/dL   Urobilinogen, UA 1.0  0.0 - 1.0 mg/dL   Nitrite NEGATIVE  NEGATIVE   Leukocytes, UA NEGATIVE  NEGATIVE  CBC     Status: Abnormal   Collection Time    12/15/13 10:20 PM      Result Value Range   WBC 6.5  4.0 - 10.5 K/uL   RBC 3.96  3.87 - 5.11 MIL/uL   Hemoglobin 11.2 (*)  12.0 - 15.0 g/dL   HCT 31.9 (*) 36.0 - 46.0 %   MCV 80.6  78.0 - 100.0 fL   MCH 28.3  26.0 - 34.0 pg   MCHC 35.1  30.0 - 36.0 g/dL   RDW 13.8  11.5 - 15.5 %   Platelets 236  150 - 400 K/uL  COMPREHENSIVE METABOLIC PANEL     Status: Abnormal   Collection Time    12/15/13 10:20 PM      Result Value Range   Sodium 137  137 - 147 mEq/L   Potassium 4.0  3.7 - 5.3 mEq/L   Chloride 102  96 - 112 mEq/L   CO2 23  19 - 32 mEq/L   Glucose, Bld 74  70 - 99 mg/dL   BUN 11  6 - 23 mg/dL   Creatinine, Ser 0.48 (*) 0.50 - 1.10 mg/dL   Calcium 9.0  8.4 - 10.5 mg/dL   Total Protein 6.5  6.0 - 8.3 g/dL   Albumin 2.6 (*) 3.5 - 5.2 g/dL   AST 11  0 - 37 U/L   ALT 12  0 - 35 U/L   Alkaline Phosphatase 148 (*) 39 - 117 U/L   Total Bilirubin 0.3  0.3 - 1.2 mg/dL   GFR calc non Af Amer >90  >90 mL/min   GFR calc Af Amer >90  >90 mL/min    Assessment and Plan    1. Postprandial RUQ pain    Consider gallbladder sono if pain continues Return to MAU as needed  Follow-up Information   Follow up with MORRIS, [place], DO. (as planned. Call the office if pain worsens )    Specialty:  Obstetrics and Gynecology   Contact information:   [address], [place], [place] [address] [phone]        Mathis Bud 12/15/2013, 10:06 PM

## 2014-01-02 ENCOUNTER — Encounter (HOSPITAL_COMMUNITY): Payer: Self-pay | Admitting: Pharmacist

## 2014-01-13 ENCOUNTER — Encounter (HOSPITAL_COMMUNITY): Payer: Self-pay

## 2014-01-13 NOTE — Patient Instructions (Addendum)
   Your procedure is scheduled on: Wednesday, Feb 4  Enter through the [employer] [facility] at: [place] up the phone at the desk and dial [phone] and inform us of your arrival.  Please call this number if you have any problems the morning of surgery: [phone]  Remember: Do not eat food after midnight: Tuesday Do not drink clear liquids after: 9 AM Wednesday,day of surgery Take these medicines the morning of surgery with a SIP OF WATER:  ALDOMET, ZANTAC  Do not wear jewelry, make-up, or FINGER nail polish No metal in your hair or on your body. Do not wear lotions, powders, perfumes.  You may wear deodorant.  Do not bring valuables to the hospital. Contacts, dentures or bridgework may not be worn into surgery.  Leave suitcase in the car. After Surgery it may be brought to your room. For patients being admitted to the hospital, checkout time is 11:00am the day of discharge.  Home with - to be arranged prior to surgery.

## 2014-01-14 ENCOUNTER — Other Ambulatory Visit (HOSPITAL_COMMUNITY): Payer: BC Managed Care – PPO

## 2014-01-14 ENCOUNTER — Encounter (HOSPITAL_COMMUNITY): Payer: Self-pay

## 2014-01-14 ENCOUNTER — Encounter (HOSPITAL_COMMUNITY)
Admission: RE | Admit: 2014-01-14 | Discharge: 2014-01-14 | Disposition: A | Discharge status: Home / Self Care | Payer: BC Managed Care – PPO | Source: Ambulatory Visit | Attending: Obstetrics and Gynecology | Admitting: Obstetrics and Gynecology

## 2014-01-14 ENCOUNTER — Encounter (HOSPITAL_COMMUNITY): Payer: Self-pay | Admitting: Pharmacy Technician

## 2014-01-14 HISTORY — DX: Headache: R51

## 2014-01-14 LAB — BASIC METABOLIC PANEL
BUN: 15 mg/dL (ref 6–23)
CHLORIDE: 103 meq/L (ref 96–112)
CO2: 22 mEq/L (ref 19–32)
CREATININE: 0.57 mg/dL (ref 0.50–1.10)
Calcium: 9 mg/dL (ref 8.4–10.5)
GFR calc Af Amer: >90 mL/min (ref >90)
GLUCOSE: 82 mg/dL (ref 70–99)
POTASSIUM: 3.9 meq/L (ref 3.7–5.3)
Sodium: 137 mEq/L (ref 137–147)

## 2014-01-14 LAB — CBC
HEMATOCRIT: 30.9 % — AB (ref 36.0–46.0)
HEMOGLOBIN: 10.6 g/dL — AB (ref 12.0–15.0)
MCH: 27.5 pg (ref 26.0–34.0)
MCHC: 34.3 g/dL (ref 30.0–36.0)
MCV: 80.1 fL (ref 78.0–100.0)
Platelets: 218 10*3/uL (ref 150–400)
RBC: 3.86 MIL/uL — ABNORMAL LOW (ref 3.87–5.11)
RDW: 14 % (ref 11.5–15.5)
WBC: 5.7 10*3/uL (ref 4.0–10.5)

## 2014-01-14 LAB — TYPE AND SCREEN
ABO/RH(D): O POS
Antibody Screen: NEGATIVE

## 2014-01-14 MED ORDER — DEXTROSE 5 % IV SOLN
3.0000 g | INTRAVENOUS | Status: AC
  Administered 2014-01-15: 3 g via INTRAVENOUS
  Filled 2014-01-14: qty 3000

## 2014-01-14 NOTE — H&P (Addendum)
28 yo G2P1 @ 38 wks presents for primary c-section, breech presentation.  Pregnancy complicated by CHTN, BP range 140-150/100.  Treated with methyldopa.    Past History - see hollister  AF, VSS gEn NAD CV - RRR Lungs - clear Abd - gravid, NT Ext - bilateral edema Cvx 1-2cm  A/P:  Breech presentation - confirmed by [place].  Desires sterilization Repeat c-section & BPS R/b/a discussed, informed consent

## 2014-01-15 ENCOUNTER — Encounter (HOSPITAL_COMMUNITY): Payer: Self-pay

## 2014-01-15 ENCOUNTER — Inpatient Hospital Stay (HOSPITAL_COMMUNITY): Payer: BC Managed Care – PPO | Admitting: Anesthesiology

## 2014-01-15 ENCOUNTER — Encounter (HOSPITAL_COMMUNITY): Payer: BC Managed Care – PPO | Admitting: Anesthesiology

## 2014-01-15 ENCOUNTER — Inpatient Hospital Stay (HOSPITAL_COMMUNITY)
Admission: AD | Admit: 2014-01-15 | Discharge: 2014-01-18 | DRG: 765 | Disposition: A | Discharge status: Home / Self Care | Payer: BC Managed Care – PPO | Source: Ambulatory Visit | Attending: Obstetrics and Gynecology | Admitting: Obstetrics and Gynecology

## 2014-01-15 ENCOUNTER — Encounter (HOSPITAL_COMMUNITY)
Admission: AD | Disposition: A | Discharge status: Home / Self Care | Payer: Self-pay | Source: Ambulatory Visit | Attending: Obstetrics and Gynecology

## 2014-01-15 DIAGNOSIS — E669 Obesity, unspecified: Secondary | ICD-10-CM | POA: Diagnosis present

## 2014-01-15 DIAGNOSIS — Z98891 History of uterine scar from previous surgery: Secondary | ICD-10-CM

## 2014-01-15 DIAGNOSIS — O99214 Obesity complicating childbirth: Secondary | ICD-10-CM

## 2014-01-15 DIAGNOSIS — O321XX0 Maternal care for breech presentation, not applicable or unspecified: Principal | ICD-10-CM | POA: Diagnosis present

## 2014-01-15 DIAGNOSIS — O1002 Pre-existing essential hypertension complicating childbirth: Secondary | ICD-10-CM | POA: Diagnosis present

## 2014-01-15 LAB — PLATELET COUNT: Platelets: 196 10*3/uL (ref 150–400)

## 2014-01-15 SURGERY — Surgical Case
Anesthesia: Spinal | Site: Abdomen | Laterality: Bilateral

## 2014-01-15 MED ORDER — PRENATAL MULTIVITAMIN CH
1.0000 | ORAL_TABLET | Freq: Every day | ORAL | Status: DC
  Administered 2014-01-16 – 2014-01-17 (×2): 1 via ORAL
  Filled 2014-01-15 (×2): qty 1

## 2014-01-15 MED ORDER — ONDANSETRON HCL 4 MG/2ML IJ SOLN: 4.0000 mg | INTRAMUSCULAR | Status: DC | PRN

## 2014-01-15 MED ORDER — OXYTOCIN 10 UNIT/ML IJ SOLN
INTRAMUSCULAR | Status: AC
  Filled 2014-01-15: qty 4

## 2014-01-15 MED ORDER — NALBUPHINE HCL 10 MG/ML IJ SOLN: 5.0000 mg | INTRAMUSCULAR | Status: DC | PRN

## 2014-01-15 MED ORDER — FENTANYL CITRATE 0.05 MG/ML IJ SOLN: 25.0000 ug | INTRAMUSCULAR | Status: DC | PRN

## 2014-01-15 MED ORDER — MEPERIDINE HCL 25 MG/ML IJ SOLN: 6.2500 mg | INTRAMUSCULAR | Status: DC | PRN

## 2014-01-15 MED ORDER — MEASLES, MUMPS & RUBELLA VAC ~~LOC~~ INJ
0.5000 mL | INJECTION | Freq: Once | SUBCUTANEOUS | Status: DC
  Filled 2014-01-15: qty 0.5

## 2014-01-15 MED ORDER — NALOXONE HCL 0.4 MG/ML IJ SOLN: 0.4000 mg | INTRAMUSCULAR | Status: DC | PRN

## 2014-01-15 MED ORDER — OXYTOCIN 40 UNITS IN LACTATED RINGERS INFUSION - SIMPLE MED: 62.5000 mL/h | INTRAVENOUS | Status: AC

## 2014-01-15 MED ORDER — IBUPROFEN 600 MG PO TABS
600.0000 mg | ORAL_TABLET | Freq: Four times a day (QID) | ORAL | Status: DC
  Administered 2014-01-16 – 2014-01-18 (×10): 600 mg via ORAL
  Filled 2014-01-15 (×10): qty 1

## 2014-01-15 MED ORDER — LANOLIN HYDROUS EX OINT: 1.0000 "application " | TOPICAL_OINTMENT | CUTANEOUS | Status: DC | PRN

## 2014-01-15 MED ORDER — METHYLDOPA 500 MG PO TABS
500.0000 mg | ORAL_TABLET | Freq: Three times a day (TID) | ORAL | Status: DC
  Administered 2014-01-15 – 2014-01-18 (×7): 500 mg via ORAL
  Filled 2014-01-15 (×9): qty 1

## 2014-01-15 MED ORDER — DEXTROSE 5 % IV SOLN
1.0000 ug/kg/h | INTRAVENOUS | Status: DC | PRN
  Filled 2014-01-15: qty 2

## 2014-01-15 MED ORDER — SCOPOLAMINE 1 MG/3DAYS TD PT72: 1.0000 | MEDICATED_PATCH | Freq: Once | TRANSDERMAL | Status: DC

## 2014-01-15 MED ORDER — MORPHINE SULFATE 0.5 MG/ML IJ SOLN
INTRAMUSCULAR | Status: AC
  Filled 2014-01-15: qty 10

## 2014-01-15 MED ORDER — PHENYLEPHRINE 8 MG IN D5W 100 ML (0.08MG/ML) PREMIX OPTIME
INJECTION | INTRAVENOUS | Status: DC | PRN
  Administered 2014-01-15: 60 ug/min via INTRAVENOUS

## 2014-01-15 MED ORDER — BUPIVACAINE IN DEXTROSE 0.75-8.25 % IT SOLN
INTRATHECAL | Status: DC | PRN
  Administered 2014-01-15: 1.5 mL via INTRATHECAL

## 2014-01-15 MED ORDER — PHENYLEPHRINE 8 MG IN D5W 100 ML (0.08MG/ML) PREMIX OPTIME
INJECTION | INTRAVENOUS | Status: AC
  Filled 2014-01-15: qty 100

## 2014-01-15 MED ORDER — SIMETHICONE 80 MG PO CHEW
80.0000 mg | CHEWABLE_TABLET | Freq: Three times a day (TID) | ORAL | Status: DC
  Administered 2014-01-15 – 2014-01-18 (×5): 80 mg via ORAL
  Filled 2014-01-15 (×7): qty 1

## 2014-01-15 MED ORDER — SIMETHICONE 80 MG PO CHEW
80.0000 mg | CHEWABLE_TABLET | ORAL | Status: DC
  Administered 2014-01-16 – 2014-01-18 (×2): 80 mg via ORAL
  Filled 2014-01-15 (×2): qty 1

## 2014-01-15 MED ORDER — ONDANSETRON HCL 4 MG/2ML IJ SOLN
INTRAMUSCULAR | Status: DC | PRN
  Administered 2014-01-15: 4 mg via INTRAVENOUS

## 2014-01-15 MED ORDER — KETOROLAC TROMETHAMINE 30 MG/ML IJ SOLN
INTRAMUSCULAR | Status: AC
  Filled 2014-01-15: qty 1

## 2014-01-15 MED ORDER — DEXTROSE IN LACTATED RINGERS 5 % IV SOLN: INTRAVENOUS | Status: DC

## 2014-01-15 MED ORDER — MENTHOL 3 MG MT LOZG: 1.0000 | LOZENGE | OROMUCOSAL | Status: DC | PRN

## 2014-01-15 MED ORDER — LACTATED RINGERS IV SOLN
INTRAVENOUS | Status: DC
  Administered 2014-01-15 (×2): via INTRAVENOUS

## 2014-01-15 MED ORDER — METOCLOPRAMIDE HCL 5 MG/ML IJ SOLN: 10.0000 mg | Freq: Three times a day (TID) | INTRAMUSCULAR | Status: DC | PRN

## 2014-01-15 MED ORDER — SENNOSIDES-DOCUSATE SODIUM 8.6-50 MG PO TABS
2.0000 | ORAL_TABLET | ORAL | Status: DC
  Administered 2014-01-16 – 2014-01-18 (×3): 2 via ORAL
  Filled 2014-01-15 (×3): qty 2

## 2014-01-15 MED ORDER — DIPHENHYDRAMINE HCL 25 MG PO CAPS
25.0000 mg | ORAL_CAPSULE | ORAL | Status: DC | PRN
  Filled 2014-01-15: qty 1

## 2014-01-15 MED ORDER — FENTANYL CITRATE 0.05 MG/ML IJ SOLN
INTRAMUSCULAR | Status: AC
  Filled 2014-01-15: qty 2

## 2014-01-15 MED ORDER — KETOROLAC TROMETHAMINE 30 MG/ML IJ SOLN
30.0000 mg | Freq: Four times a day (QID) | INTRAMUSCULAR | Status: AC | PRN
  Administered 2014-01-15: 30 mg via INTRAMUSCULAR

## 2014-01-15 MED ORDER — DIPHENHYDRAMINE HCL 25 MG PO CAPS: 25.0000 mg | ORAL_CAPSULE | Freq: Four times a day (QID) | ORAL | Status: DC | PRN

## 2014-01-15 MED ORDER — MIDAZOLAM HCL 2 MG/2ML IJ SOLN: 0.5000 mg | Freq: Once | INTRAMUSCULAR | Status: DC | PRN

## 2014-01-15 MED ORDER — NALBUPHINE HCL 10 MG/ML IJ SOLN
5.0000 mg | INTRAMUSCULAR | Status: DC | PRN
  Administered 2014-01-16: 5 mg via INTRAVENOUS
  Filled 2014-01-15: qty 1

## 2014-01-15 MED ORDER — MORPHINE SULFATE (PF) 0.5 MG/ML IJ SOLN
INTRAMUSCULAR | Status: DC | PRN
  Administered 2014-01-15: .1 mg via INTRATHECAL

## 2014-01-15 MED ORDER — SCOPOLAMINE 1 MG/3DAYS TD PT72
MEDICATED_PATCH | TRANSDERMAL | Status: AC
  Administered 2014-01-15: 12:00:00 1.5 mg via TRANSDERMAL
  Filled 2014-01-15: qty 1

## 2014-01-15 MED ORDER — TETANUS-DIPHTH-ACELL PERTUSSIS 5-2.5-18.5 LF-MCG/0.5 IM SUSP: 0.5000 mL | Freq: Once | INTRAMUSCULAR | Status: DC

## 2014-01-15 MED ORDER — HYDROCODONE-ACETAMINOPHEN 5-325 MG PO TABS
1.0000 | ORAL_TABLET | ORAL | Status: DC | PRN
  Administered 2014-01-16 – 2014-01-18 (×5): 1 via ORAL
  Filled 2014-01-15 (×6): qty 1

## 2014-01-15 MED ORDER — SCOPOLAMINE 1 MG/3DAYS TD PT72
1.0000 | MEDICATED_PATCH | Freq: Once | TRANSDERMAL | Status: DC
  Administered 2014-01-15: 1.5 mg via TRANSDERMAL

## 2014-01-15 MED ORDER — SODIUM CHLORIDE 0.9 % IJ SOLN: 3.0000 mL | INTRAMUSCULAR | Status: DC | PRN

## 2014-01-15 MED ORDER — LACTATED RINGERS IV SOLN
40.0000 [IU] | INTRAVENOUS | Status: DC | PRN
  Administered 2014-01-15: 40 [IU] via INTRAVENOUS

## 2014-01-15 MED ORDER — DIBUCAINE 1 % RE OINT: 1.0000 "application " | TOPICAL_OINTMENT | RECTAL | Status: DC | PRN

## 2014-01-15 MED ORDER — WITCH HAZEL-GLYCERIN EX PADS: 1.0000 "application " | MEDICATED_PAD | CUTANEOUS | Status: DC | PRN

## 2014-01-15 MED ORDER — ONDANSETRON HCL 4 MG PO TABS: 4.0000 mg | ORAL_TABLET | ORAL | Status: DC | PRN

## 2014-01-15 MED ORDER — KETOROLAC TROMETHAMINE 30 MG/ML IJ SOLN
30.0000 mg | Freq: Four times a day (QID) | INTRAMUSCULAR | Status: AC | PRN
  Administered 2014-01-15: 30 mg via INTRAVENOUS
  Filled 2014-01-15: qty 1

## 2014-01-15 MED ORDER — PROMETHAZINE HCL 25 MG/ML IJ SOLN: 6.2500 mg | INTRAMUSCULAR | Status: DC | PRN

## 2014-01-15 MED ORDER — ONDANSETRON HCL 4 MG/2ML IJ SOLN: 4.0000 mg | Freq: Three times a day (TID) | INTRAMUSCULAR | Status: DC | PRN

## 2014-01-15 MED ORDER — FENTANYL CITRATE 0.05 MG/ML IJ SOLN
INTRAMUSCULAR | Status: DC | PRN
  Administered 2014-01-15: 15 ug via INTRATHECAL

## 2014-01-15 MED ORDER — DIPHENHYDRAMINE HCL 50 MG/ML IJ SOLN
25.0000 mg | INTRAMUSCULAR | Status: DC | PRN
Start: 2014-01-15 — End: 2014-01-18

## 2014-01-15 MED ORDER — ACETAMINOPHEN 500 MG PO TABS
1000.0000 mg | ORAL_TABLET | Freq: Four times a day (QID) | ORAL | Status: AC
  Filled 2014-01-15: qty 2

## 2014-01-15 MED ORDER — DIPHENHYDRAMINE HCL 50 MG/ML IJ SOLN
12.5000 mg | INTRAMUSCULAR | Status: DC | PRN
  Administered 2014-01-15 – 2014-01-16 (×2): 12.5 mg via INTRAVENOUS
  Filled 2014-01-15 (×3): qty 1

## 2014-01-15 MED ORDER — FAMOTIDINE 20 MG PO TABS
20.0000 mg | ORAL_TABLET | Freq: Two times a day (BID) | ORAL | Status: DC
  Administered 2014-01-15 – 2014-01-18 (×4): 20 mg via ORAL
  Filled 2014-01-15 (×5): qty 1

## 2014-01-15 MED ORDER — IBUPROFEN 600 MG PO TABS: 600.0000 mg | ORAL_TABLET | Freq: Four times a day (QID) | ORAL | Status: DC | PRN

## 2014-01-15 MED ORDER — ONDANSETRON HCL 4 MG/2ML IJ SOLN
INTRAMUSCULAR | Status: AC
  Filled 2014-01-15: qty 2

## 2014-01-15 MED ORDER — SIMETHICONE 80 MG PO CHEW: 80.0000 mg | CHEWABLE_TABLET | ORAL | Status: DC | PRN

## 2014-01-15 MED ORDER — MEDROXYPROGESTERONE ACETATE 150 MG/ML IM SUSP: 150.0000 mg | INTRAMUSCULAR | Status: DC | PRN

## 2014-01-15 SURGICAL SUPPLY — 34 items
CLAMP CORD UMBIL (MISCELLANEOUS) IMPLANT
CLOTH BEACON ORANGE TIMEOUT ST (SAFETY) ×3 IMPLANT
DERMABOND ADVANCED (GAUZE/BANDAGES/DRESSINGS) ×2
DERMABOND ADVANCED .7 DNX12 (GAUZE/BANDAGES/DRESSINGS) ×1 IMPLANT
DRAPE LG THREE QUARTER DISP (DRAPES) IMPLANT
DRSG OPSITE POSTOP 4X10 (GAUZE/BANDAGES/DRESSINGS) ×3 IMPLANT
DURAPREP 26ML APPLICATOR (WOUND CARE) ×3 IMPLANT
ELECT REM PT RETURN 9FT ADLT (ELECTROSURGICAL) ×3
ELECTRODE REM PT RTRN 9FT ADLT (ELECTROSURGICAL) ×1 IMPLANT
EXTRACTOR VACUUM M CUP 4 TUBE (SUCTIONS) IMPLANT
EXTRACTOR VACUUM M CUP 4' TUBE (SUCTIONS)
GLOVE BIO SURGEON STRL SZ 6.5 (GLOVE) ×2 IMPLANT
GLOVE BIO SURGEONS STRL SZ 6.5 (GLOVE) ×1
GLOVE BIOGEL PI IND STRL 7.0 (GLOVE) ×1 IMPLANT
GLOVE BIOGEL PI INDICATOR 7.0 (GLOVE) ×2
GOWN STRL REUS W/TWL LRG LVL3 (GOWN DISPOSABLE) ×6 IMPLANT
KIT ABG SYR 3ML LUER SLIP (SYRINGE) IMPLANT
NEEDLE HYPO 25X5/8 SAFETYGLIDE (NEEDLE) IMPLANT
NS IRRIG 1000ML POUR BTL (IV SOLUTION) ×3 IMPLANT
PACK C SECTION WH (CUSTOM PROCEDURE TRAY) ×3 IMPLANT
PAD ABD 7.5X8 STRL (GAUZE/BANDAGES/DRESSINGS) ×3 IMPLANT
PAD OB MATERNITY 4.3X12.25 (PERSONAL CARE ITEMS) ×3 IMPLANT
SPONGE GAUZE 4X4 12PLY (GAUZE/BANDAGES/DRESSINGS) ×3 IMPLANT
STAPLER VISISTAT 35W (STAPLE) IMPLANT
SUT CHROMIC 0 CT 802H (SUTURE) IMPLANT
SUT CHROMIC 0 CTX 36 (SUTURE) ×9 IMPLANT
SUT MON AB-0 CT1 36 (SUTURE) ×3 IMPLANT
SUT PDS AB 0 CTX 60 (SUTURE) ×3 IMPLANT
SUT PLAIN 0 NONE (SUTURE) IMPLANT
SUT PLAIN 2 0 XLH (SUTURE) ×3 IMPLANT
SUT VIC AB 4-0 KS 27 (SUTURE) ×3 IMPLANT
TOWEL OR 17X24 6PK STRL BLUE (TOWEL DISPOSABLE) ×3 IMPLANT
TRAY FOLEY CATH 14FR (SET/KITS/TRAYS/PACK) IMPLANT
WATER STERILE IRR 1000ML POUR (IV SOLUTION) IMPLANT

## 2014-01-15 NOTE — Anesthesia Postprocedure Evaluation (Signed)
  Anesthesia Post Note  Patient: Heidi Willis  Procedure(s) Performed: Procedure(s) (LRB): CESAREAN SECTION WITH BILATERAL TUBAL LIGATION (Bilateral)  Anesthesia type: Spinal  Patient location: PACU  Post pain: Pain level controlled  Post assessment: Post-op Vital signs reviewed  Last Vitals:  Filed Vitals:   01/15/14 1144  BP: 134/92  Pulse:   Temp:   Resp:     Post vital signs: Reviewed  Level of consciousness: awake  Complications: No apparent anesthesia complications

## 2014-01-15 NOTE — Anesthesia Preprocedure Evaluation (Addendum)
Anesthesia Evaluation  Patient identified by MRN, date of birth, ID band Patient awake    Reviewed: Allergy & Precautions, H&P , NPO status , Patient's Chart, lab work & pertinent test results  Airway Mallampati: III      Dental   Pulmonary  breath sounds clear to auscultation        Cardiovascular Exercise Tolerance: Good hypertension, Rhythm:regular Rate:Normal     Neuro/Psych  Headaches,    GI/Hepatic   Endo/Other  Morbid obesity  Renal/GU      Musculoskeletal   Abdominal   Peds  Hematology   Anesthesia Other Findings   Reproductive/Obstetrics (+) Pregnancy                          Anesthesia Physical Anesthesia Plan  ASA: III  Anesthesia Plan: Spinal   Post-op Pain Management:    Induction:   Airway Management Planned:   Additional Equipment:   Intra-op Plan:   Post-operative Plan:   Informed Consent: I have reviewed the patients History and Physical, chart, labs and discussed the procedure including the risks, benefits and alternatives for the proposed anesthesia with the patient or authorized representative who has indicated his/her understanding and acceptance.     Plan Discussed with: Anesthesiologist, CRNA and Surgeon  Anesthesia Plan Comments:         Anesthesia Quick Evaluation

## 2014-01-15 NOTE — Op Note (Signed)
Cesarean Section Procedure Note   Heidi Willis  01/15/2014  Indications: Breech Presentation and desires sterilization   Pre-operative Diagnosis: BREECH, HYPERTENSION, desires sterilization, morbid obesity   Post-operative Diagnosis: Same   Surgeon: Surgeon(s) and Role:    * Marylynn Pearson, MD - Primary    * Linda Hedges, DO - Assisting   Assistants: Jerilynn Mages. Morris, DO  Anesthesia: spinal   Procedure Details:  The patient was seen in the Holding Room. The risks, benefits, complications, treatment options, and expected outcomes were discussed with the patient. The patient concurred with the proposed plan, giving informed consent. identified as [name] and the procedure verified as C-Section Delivery. A Time Out was held and the above information confirmed.  After induction of anesthesia, the patient was draped and prepped in the usual sterile manner. A transverse was made and carried down through the subcutaneous tissue to the fascia. Fascial incision was made and extended transversely. The fascia was separated from the underlying rectus tissue superiorly and inferiorly. The peritoneum was identified and entered. Peritoneal incision was extended longitudinally. The utero-vesical peritoneal reflection was incised transversely and the bladder flap was bluntly freed from the lower uterine segment. A low transverse uterine incision was made. Delivered from breech complete presentation was a vigerous female. Cord ph was not sent the umbilical cord was clamped and cut cord blood was obtained for evaluation. The placenta was removed Intact and appeared normal. The uterine outline, tubes and ovaries appeared normal}. The uterine incision was closed with running locked sutures of 0chromic gut.   Hemostasis was observed. Lavage was carried out until clear. Peritoneum was closed with monocryl.  The fascia was then reapproximated with running sutures of 0PDS. The subcuticular closure was performed using  2-0plain gut. The skin was closed with 4-0Vicryl.   Instrument, sponge, and needle counts were correct prior the abdominal closure and were correct at the conclusion of the case.     Estimated Blood Loss: 600cc   Urine Output: clear  Specimens: @ORSPECIMEN @   Complications: no complications  Disposition: PACU - hemodynamically stable.   Maternal Condition: stable   Baby condition / location:  Couplet care / Skin to Skin  Attending Attestation: I was present and scrubbed for the entire procedure.   Signed: Surgeon(s): Marylynn Pearson, MD Linda Hedges, DO

## 2014-01-15 NOTE — Lactation Note (Signed)
This note was copied from the chart of Heidi Willis. Lactation Consultation Note  Patient Name: Heidi Willis [id number] Date: 01/15/2014 Reason for consult: Initial assessment;Other (Comment) (charting for exclusion)   Maternal Data Formula Feeding for Exclusion: Yes Reason for exclusion: Mother's choice to formula feed on admision  Feeding Feeding Type: Formula Length of feed: 4 min  LATCH Score/Interventions                      Lactation Tools Discussed/Used     Consult Status Consult Status: Complete    Bernita Buffy 01/15/2014, 3:33 PM

## 2014-01-15 NOTE — Transfer of Care (Signed)
Immediate Anesthesia Transfer of Care Note  Patient: Heidi Willis  Procedure(s) Performed: Procedure(s) with comments: CESAREAN SECTION WITH BILATERAL TUBAL LIGATION (Bilateral) - PRIMARY [facility] 2/15  Patient Location: PACU  Anesthesia Type:Spinal  Level of Consciousness: awake, alert  and oriented  Airway & Oxygen Therapy: Patient Spontanous Breathing  Post-op Assessment: Report given to PACU RN and Post -op Vital signs reviewed and stable  Post vital signs: Reviewed and stable  Complications: No apparent anesthesia complications

## 2014-01-16 LAB — BIRTH TISSUE RECOVERY COLLECTION (PLACENTA DONATION)

## 2014-01-16 LAB — CBC
HCT: 26.8 % — ABNORMAL LOW (ref 36.0–46.0)
HEMOGLOBIN: 9.4 g/dL — AB (ref 12.0–15.0)
MCH: 28.1 pg (ref 26.0–34.0)
MCHC: 35.1 g/dL (ref 30.0–36.0)
MCV: 80 fL (ref 78.0–100.0)
PLATELETS: 189 10*3/uL (ref 150–400)
RBC: 3.35 MIL/uL — ABNORMAL LOW (ref 3.87–5.11)
RDW: 14 % (ref 11.5–15.5)
WBC: 8.2 10*3/uL (ref 4.0–10.5)

## 2014-01-16 NOTE — Anesthesia Postprocedure Evaluation (Signed)
  Anesthesia Post-op Note  Patient: Heidi Willis  Procedure(s) Performed: Procedure(s) with comments: CESAREAN SECTION WITH BILATERAL TUBAL LIGATION (Bilateral) - PRIMARY [facility] 2/15  Patient Location: Mother/Baby  Anesthesia Type:Spinal  Level of Consciousness: awake  Airway and Oxygen Therapy: Patient Spontanous Breathing  Post-op Pain: mild  Post-op Assessment: Patient's Cardiovascular Status Stable and Respiratory Function Stable  Post-op Vital Signs: stable  Complications: No apparent anesthesia complications

## 2014-01-16 NOTE — Progress Notes (Signed)
Subjective: Postpartum Day 1: Cesarean Delivery Patient reports tolerating PO and + flatus.    Objective: Vital signs in last 24 hours: Temp:  [97.3 F (36.3 C)-98.5 F (36.9 C)] 97.5 F (36.4 C) (02/05 0600) Pulse Rate:  [52-80] 77 (02/05 0600) Resp:  [15-20] 18 (02/05 0600) BP: (100-140)/(65-92) 100/65 mmHg (02/05 0600) SpO2:  [96 %-100 %] 100 % (02/05 0600) Weight:  [320 lb (145.151 kg)] 320 lb (145.151 kg) (02/04 1300)  Physical Exam:  General: alert and cooperative Lochia: appropriate Uterine Fundus: firm Incision: abd dressing CDI DVT Evaluation: No evidence of DVT seen on physical exam. Negative Homan's sign. No cords or calf tenderness. Calf/Ankle edema is present. DTR's 1+ Foley discontinued, has not voided at the time of rounds  Recent Labs  01/14/14 1145 01/16/14 0603  HGB 10.6* 9.4*  HCT 30.9* 26.8*    Assessment/Plan: Status post Cesarean section. Doing well postoperatively.  Continue current care.  CURTIS,CAROL G 01/16/2014, 8:10 AM

## 2014-01-17 ENCOUNTER — Encounter (HOSPITAL_COMMUNITY): Payer: Self-pay | Admitting: Obstetrics and Gynecology

## 2014-01-17 LAB — RPR: RPR: NONREACTIVE

## 2014-01-17 NOTE — Progress Notes (Signed)
Patient does not have current RPR; please order.

## 2014-01-17 NOTE — Progress Notes (Signed)
Subjective: Postpartum Day 2: Cesarean Delivery Patient reports tolerating PO, + flatus and no problems voiding.  Denies HA, blurred vision  Objective: Vital signs in last 24 hours: Temp:  [97.9 F (36.6 C)-99 F (37.2 C)] 98.3 F (36.8 C) (02/06 0519) Pulse Rate:  [78-84] 78 (02/06 0519) Resp:  [16-20] 18 (02/06 0519) BP: (120-134)/(80-86) 133/85 mmHg (02/06 0519) SpO2:  [96 %] 96 % (02/05 1300)  Physical Exam:  General: alert and cooperative Lochia: appropriate Uterine Fundus: firm Incision: honeycomb dressing appears intact and without drainage noted, portion of abd dressing continues DVT Evaluation: No evidence of DVT seen on physical exam. Negative Homan's sign. No cords or calf tenderness. Calf/Ankle edema is present. DTR's 1+   Recent Labs  01/14/14 1145 01/16/14 0603  HGB 10.6* 9.4*  HCT 30.9* 26.8*    Assessment/Plan: Status post Cesarean section. Postoperative course complicated by [facility]  Continue current care.  Iretha Kirley G 01/17/2014, 8:08 AM

## 2014-01-18 MED ORDER — IBUPROFEN 600 MG PO TABS: 600.0000 mg | ORAL_TABLET | Freq: Four times a day (QID) | ORAL | Status: DC | PRN

## 2014-01-18 MED ORDER — HYDROCODONE-ACETAMINOPHEN 5-325 MG PO TABS: 1.0000 | ORAL_TABLET | ORAL | Status: DC | PRN

## 2014-01-18 NOTE — Discharge Summary (Signed)
Obstetric Discharge Summary Reason for Admission: onset of labor and cesarean section Prenatal Procedures: hypertension/BREECH Intrapartum Procedures: cesarean: low cervical, transverse/BTL Postpartum Procedures: none Complications-Operative and Postpartum: none Hemoglobin  Date Value Range Status  01/16/2014 9.4* 12.0 - 15.0 g/dL Final     HCT  Date Value Range Status  01/16/2014 26.8* 36.0 - 46.0 % Final    Physical Exam:  General: alert Lochia: appropriate Uterine Fundus: firm Incision: healing well DVT Evaluation: No evidence of DVT seen on physical exam.  Discharge Diagnoses: Term Pregnancy-delivered and chronic HTN/BREECH  Discharge Information: Date: 01/18/2014 Activity: pelvic rest Diet: routine Medications: PNV, Ibuprofen and Vicodin/ALDOMET Condition: stable Instructions: refer to practice specific booklet Discharge to: home Follow-up Information   Follow up with Margarette Asal, MD. Schedule an appointment as soon as possible for a visit in 5 days.   Specialty:  Obstetrics and Gynecology   Contact information:   [address] [place] [address] [phone]       Newborn Data: Live born female  Birth Weight: 7 lb 3.2 oz (3266 g) APGAR: 8, 9  Home with mother.  Margarette Asal 01/18/2014, 8:46 AM

## 2014-01-26 ENCOUNTER — Inpatient Hospital Stay (HOSPITAL_COMMUNITY)
Admission: AD | Admit: 2014-01-26 | Payer: BC Managed Care – PPO | Source: Ambulatory Visit | Admitting: Obstetrics and Gynecology

## 2014-01-30 ENCOUNTER — Encounter (HOSPITAL_COMMUNITY): Payer: Self-pay | Admitting: *Deleted

## 2014-04-07 ENCOUNTER — Other Ambulatory Visit: Payer: Self-pay | Admitting: Obstetrics and Gynecology

## 2014-10-13 ENCOUNTER — Encounter (HOSPITAL_COMMUNITY): Payer: Self-pay | Admitting: *Deleted

## 2015-09-02 ENCOUNTER — Emergency Department (HOSPITAL_COMMUNITY)
Admission: EM | Admit: 2015-09-02 | Discharge: 2015-09-02 | Disposition: A | Discharge status: Home / Self Care | Payer: BLUE CROSS/BLUE SHIELD | Attending: Emergency Medicine | Admitting: Emergency Medicine

## 2015-09-02 DIAGNOSIS — H66011 Acute suppurative otitis media with spontaneous rupture of ear drum, right ear: Secondary | ICD-10-CM | POA: Insufficient documentation

## 2015-09-02 DIAGNOSIS — H6091 Unspecified otitis externa, right ear: Secondary | ICD-10-CM | POA: Diagnosis not present

## 2015-09-02 DIAGNOSIS — I10 Essential (primary) hypertension: Secondary | ICD-10-CM | POA: Diagnosis not present

## 2015-09-02 DIAGNOSIS — Z79899 Other long term (current) drug therapy: Secondary | ICD-10-CM | POA: Insufficient documentation

## 2015-09-02 DIAGNOSIS — Z8709 Personal history of other diseases of the respiratory system: Secondary | ICD-10-CM | POA: Diagnosis not present

## 2015-09-02 DIAGNOSIS — H9201 Otalgia, right ear: Secondary | ICD-10-CM

## 2015-09-02 MED ORDER — HYDROCODONE-ACETAMINOPHEN 5-325 MG PO TABS: 1.0000 | ORAL_TABLET | Freq: Four times a day (QID) | ORAL | Status: DC | PRN

## 2015-09-02 MED ORDER — CIPROFLOXACIN-HYDROCORTISONE 0.2-1 % OT SUSP: 3.0000 [drp] | Freq: Two times a day (BID) | OTIC | Status: DC

## 2015-09-02 MED ORDER — AMOXICILLIN 500 MG PO TABS: 1000.0000 mg | ORAL_TABLET | Freq: Two times a day (BID) | ORAL | Status: DC

## 2015-09-02 MED ORDER — NAPROXEN 500 MG PO TABS: 500.0000 mg | ORAL_TABLET | Freq: Two times a day (BID) | ORAL | Status: DC | PRN

## 2015-09-02 MED ORDER — CIPROFLOXACIN HCL 0.3 % OP SOLN: 3.0000 [drp] | Freq: Three times a day (TID) | OPHTHALMIC | Status: DC

## 2015-09-02 NOTE — ED Notes (Signed)
Pt assessed and discharged by [facility]

## 2015-09-02 NOTE — ED Notes (Addendum)
Pt reports right ear pain since Monday. Pain 10/10. Pt reports this morning red drainage from ear. Has been using OTC ear drops with no relief.

## 2015-09-02 NOTE — ED Provider Notes (Signed)
CSN: [id number]     Arrival date & time 09/02/15  1514 History  This chart was scribed for non-physician practitioner, Zacarias Pontes, PA-C working with Milton Ferguson, MD by Rayna Sexton, ED scribe. This patient was seen in room WTR6/WTR6 and the patient's care was started at 3:50 PM.   Chief Complaint  Patient presents with  . Otalgia   Patient is a 29 y.o. female presenting with ear pain. The history is provided by the patient. No language interpreter was used.  Otalgia Location:  Right Behind ear:  No abnormality Quality:  Throbbing Severity:  Moderate Onset quality:  Sudden Duration:  2 days Timing:  Constant Progression:  Unchanged Chronicity:  New Context: not elevation change and no water in ear   Relieved by:  Nothing Worsened by:  Nothing tried Ineffective treatments:  OTC medications Associated symptoms: ear discharge and hearing loss   Associated symptoms: no abdominal pain, no congestion, no diarrhea, no fever, no headaches, no rhinorrhea, no sore throat, no tinnitus and no vomiting   Risk factors: no recent travel     HPI Comments: Heidi Willis is a 29 y.o. female who presents to the Emergency Department complaining of constant, 10/10, throbbing right ear pain with onset 2 days ago. Pt states the pain is constant, non-radiating, and notes taking NSAIDs which provided very mild relief as well as OTC ear drops which provided no relief. Pt notes some mild bloody discharge from the affected ear last night and today. Pt denies any recent swimming, air travel or sick contacts. She endorses associated diminished hearing in R ear but no full hearing loss. Denies fevers, chills, rhinorrhea, sore throat, water eyes, eye itchiness, sinus congestion, CP, SOB, abd pain, n/v/d, constipation, dysuria, hematuria, body aches, numbness, tingling and weakness.   Past Medical History  Diagnosis Date  . Bronchitis     Hx  . SVD (spontaneous vaginal delivery)     x 1  .  Hypertension     with 2015 pregnancy  . PIH (pregnancy induced hypertension) 01/2014  . Heartburn in pregnancy   . Headache(784.0)     otc meds prn   Past Surgical History  Procedure Laterality Date  . Elbow surgery      right  . Tonsillectomy    . Cesarean section with bilateral tubal ligation Bilateral 01/15/2014    Procedure: CESAREAN SECTION WITH BILATERAL TUBAL LIGATION;  Surgeon: Marylynn Pearson, MD;  Location: [facility];  Service: Obstetrics;  Laterality: Bilateral;  PRIMARY [facility] 2/15   No family history on file. Social History  Substance Use Topics  . Smoking status: Never Smoker   . Smokeless tobacco: Never Used  . Alcohol Use: No   OB History    Gravida Para Term Preterm AB TAB SAB Ectopic Multiple Living   2 1 1       1      Review of Systems  Constitutional: Negative for fever and chills.  HENT: Positive for ear discharge, ear pain and hearing loss. Negative for congestion, rhinorrhea, sinus pressure, sore throat and tinnitus.   Eyes: Negative for discharge, itching and visual disturbance.  Respiratory: Negative for shortness of breath.   Gastrointestinal: Negative for vomiting, abdominal pain, diarrhea and constipation.  Genitourinary: Negative for dysuria and hematuria.  Musculoskeletal: Negative for myalgias and arthralgias.  Skin: Negative for color change.  Allergic/Immunologic: Negative for immunocompromised state.  Neurological: Negative for weakness and headaches.  Psychiatric/Behavioral: Negative for confusion.   A complete 10 system review of systems  was obtained and all systems are negative except as noted in the HPI and PMH.    Allergies  Percocet  Home Medications   Prior to Admission medications   Medication Sig Start Date End Date Taking? Authorizing Provider  ferrous sulfate 325 (65 FE) MG tablet Take 325 mg by mouth daily with breakfast.    Historical Provider, MD  HYDROcodone-acetaminophen (NORCO/VICODIN) 5-325 MG per tablet Take 1-2 tablets  by mouth every 4 (four) hours as needed for moderate pain. 01/18/14   Molli Posey, MD  ibuprofen (ADVIL,MOTRIN) 600 MG tablet Take 1 tablet (600 mg total) by mouth every 6 (six) hours as needed for mild pain. 01/18/14   Molli Posey, MD  loratadine-pseudoephedrine (CLARITIN-D 24-HOUR) 10-240 MG per 24 hr tablet Take 1 tablet by mouth daily.    Historical Provider, MD  methyldopa (ALDOMET) 500 MG tablet Take 500 mg by mouth 3 (three) times daily.    Historical Provider, MD  Prenatal Vit-Fe Fumarate-FA (MULTIVITAMIN-PRENATAL) 27-0.8 MG TABS tablet Take 1 tablet by mouth daily at 12 noon.    Historical Provider, MD  Pseudoeph-CPM-DM-APAP (TYLENOL COLD PO) Take 30 mLs by mouth every 4 (four) hours as needed (for cold symptoms).    Historical Provider, MD   BP 159/79 mmHg  Pulse 79  Temp(Src) 98.4 F (36.9 C) (Oral)  Resp 20  SpO2 99%  LMP 08/15/2015 (Approximate) Physical Exam  Constitutional: She is oriented to person, place, and time. Vital signs are normal. She appears well-developed and well-nourished.  Non-toxic appearance. No distress.  Afebrile, nontoxic, NAD  HENT:  Head: Normocephalic and atraumatic.  Right Ear: There is drainage and swelling. Tympanic membrane is injected and perforated.  Left Ear: Hearing, tympanic membrane, external ear and ear canal normal.  Nose: Nose normal.  Mouth/Throat: Mucous membranes are normal.  Right ear with swelling and purulent drainage noted in the canal, mild pain with pinna retraction, with erythematous TM that appears to have a small perforation, loss of landmarks. Some mild bloody drainage noted. Left ear clear. Nose clear.   Eyes: Conjunctivae and EOM are normal. Right eye exhibits no discharge. Left eye exhibits no discharge.  Neck: Normal range of motion. Neck supple.  Cardiovascular: Normal rate.   Pulmonary/Chest: Effort normal. No respiratory distress.  Abdominal: Normal appearance. She exhibits no distension.  Musculoskeletal: Normal  range of motion.  Neurological: She is alert and oriented to person, place, and time. She has normal strength. No sensory deficit.  Skin: Skin is warm, dry and intact. No rash noted.  Psychiatric: She has a normal mood and affect. Her behavior is normal.  Nursing note and vitals reviewed.  ED Course  Procedures  DIAGNOSTIC STUDIES: Oxygen Saturation is 99% on RA, normal by my interpretation.    COORDINATION OF CARE: 3:58 PM Discussed treatment plan with pt at bedside and pt agreed to plan.  Labs Review Labs Reviewed - No data to display  Imaging Review No results found. I have personally reviewed and evaluated these images and lab results as part of my medical decision-making.   EKG Interpretation None      MDM   Final diagnoses:  Otalgia, right  Acute suppurative otitis media of right ear with spontaneous rupture of tympanic membrane, recurrence not specified  Otitis externa, right    29 y.o. female here with R otalgia, drainage and bleeding from ear. TM perforation but also with some evidence of AOE. Will treat with oral and otic abx. Pain meds given. Discussed strict precautions to  avoid anything entering ear. F/up with ENT in 1wk. I explained the diagnosis and have given explicit precautions to return to the ER including for any other new or worsening symptoms. The patient understands and accepts the medical plan as it's been dictated and I have answered their questions. Discharge instructions concerning home care and prescriptions have been given. The patient is STABLE and is discharged to home in good condition.   I personally performed the services described in this documentation, which was scribed in my presence. The recorded information has been reviewed and is accurate.  BP 159/79 mmHg  Pulse 79  Temp(Src) 98.4 F (36.9 C) (Oral)  Resp 20  SpO2 99%  LMP 08/15/2015 (Approximate)  Meds ordered this encounter  Medications  . amoxicillin (AMOXIL) 500 MG tablet     Sig: Take 2 tablets (1,000 mg total) by mouth 2 (two) times daily.    Dispense:  28 tablet    Refill:  0    Order Specific Question:  Supervising Provider    Answer:  MILLER, BRIAN [[id number]]  . ciprofloxacin-hydrocortisone (CIPRO HC) otic suspension    Sig: Place 3 drops into the right ear 2 (two) times daily. X 7 days    Dispense:  10 mL    Refill:  0    Order Specific Question:  Supervising Provider    Answer:  MILLER, BRIAN [[id number]]  . HYDROcodone-acetaminophen (NORCO) 5-325 MG per tablet    Sig: Take 1 tablet by mouth every 6 (six) hours as needed for severe pain.    Dispense:  6 tablet    Refill:  0    Order Specific Question:  Supervising Provider    Answer:  MILLER, BRIAN [[id number]]  . naproxen (NAPROSYN) 500 MG tablet    Sig: Take 1 tablet (500 mg total) by mouth 2 (two) times daily as needed for mild pain, moderate pain or headache (TAKE WITH MEALS.).    Dispense:  20 tablet    Refill:  0    Order Specific Question:  Supervising Provider    Answer:  Noemi Chapel [[id number]]      Mercedes Camprubi-Soms, PA-C 09/02/15 1606  Milton Ferguson, MD 09/04/15 [phone]

## 2015-09-02 NOTE — Discharge Instructions (Signed)
Avoid having anything enter your right ear, including water. Do not submerge your head. Take antibiotics and ear drops as directed. Use naprosyn and norco as directed as needed for pain but don't drive while taking norco. Follow up with the ENT doctor in 1 week for recheck of symptoms. Return to the ER for changes or worsening symptoms.   Otitis Externa Otitis externa is a germ infection in the outer ear. The outer ear is the area from the eardrum to the outside of the ear. Otitis externa is sometimes called "swimmer's ear." HOME CARE  Put drops in the ear as told by your doctor.  Only take medicine as told by your doctor.  If you have diabetes, your doctor may give you more directions. Follow your doctor's directions.  Keep all doctor visits as told. To avoid another infection:  Keep your ear dry. Use the corner of a towel to dry your ear after swimming or bathing.  Avoid scratching or putting things inside your ear.  Avoid swimming in lakes, dirty water, or pools that use a chemical called chlorine poorly.  You may use ear drops after swimming. Combine equal amounts of white vinegar and alcohol in a bottle. Put 3 or 4 drops in each ear. GET HELP IF:   You have a fever.  Your ear is still red, puffy (swollen), or painful after 3 days.  You still have yellowish-white fluid (pus) coming from the ear after 3 days.  Your redness, puffiness, or pain gets worse.  You have a really bad headache.  You have redness, puffiness, pain, or tenderness behind your ear. MAKE SURE YOU:   Understand these instructions.  Will watch your condition.  Will get help right away if you are not doing well or get worse. Document Released: 05/16/2008 Document Revised: 04/14/2014 Document Reviewed: 12/15/2011 [facility] Patient Information 2015 [place], [place]. This information is not intended to replace advice given to you by your health care provider. Make sure you discuss any questions you have with  your health care provider.  Otitis Media Otitis media is redness, soreness, and inflammation of the middle ear. Otitis media may be caused by allergies or, most commonly, by infection. Often it occurs as a complication of the common cold. SIGNS AND SYMPTOMS Symptoms of otitis media may include:  Earache.  Fever.  Ringing in your ear.  Headache.  Leakage of fluid from the ear. DIAGNOSIS To diagnose otitis media, your health care provider will examine your ear with an otoscope. This is an instrument that allows your health care provider to see into your ear in order to examine your eardrum. Your health care provider also will ask you questions about your symptoms. TREATMENT  Typically, otitis media resolves on its own within 3-5 days. Your health care provider may prescribe medicine to ease your symptoms of pain. If otitis media does not resolve within 5 days or is recurrent, your health care provider may prescribe antibiotic medicines if he or she suspects that a bacterial infection is the cause. HOME CARE INSTRUCTIONS   If you were prescribed an antibiotic medicine, finish it all even if you start to feel better.  Take medicines only as directed by your health care provider.  Keep all follow-up visits as directed by your health care provider. SEEK MEDICAL CARE IF:  You have otitis media only in one ear, or bleeding from your nose, or both.  You notice a lump on your neck.  You are not getting better in 3-5  days.  You feel worse instead of better. SEEK IMMEDIATE MEDICAL CARE IF:   You have pain that is not controlled with medicine.  You have swelling, redness, or pain around your ear or stiffness in your neck.  You notice that part of your face is paralyzed.  You notice that the bone behind your ear (mastoid) is tender when you touch it. MAKE SURE YOU:   Understand these instructions.  Will watch your condition.  Will get help right away if you are not doing well or  get worse. Document Released: 09/02/2004 Document Revised: 04/14/2014 Document Reviewed: 06/25/2013 [facility] Patient Information 2015 [place], [place]. This information is not intended to replace advice given to you by your health care provider. Make sure you discuss any questions you have with your health care provider.  Eardrum Perforation The eardrum is a thin, round tissue inside the ear that separates the ear canal from the middle ear. This is the tissue that detects sound and enables you to hear. The eardrum can be punctured or torn (perforated). Eardrums generally heal without help and with little or no permanent hearing loss. CAUSES   Sudden pressure changes that happen in situations like scuba diving or flying in an airplane.  Foreign objects in the ear.  Inserting a cotton-tipped swab in the ear.  Loud noise.  Trauma to the ear. SYMPTOMS   Hearing loss.  Ear pain.  Ringing in the ears.  Discharge or bleeding from the ear.  Dizziness.  Vomiting.  Facial paralysis. HOME CARE INSTRUCTIONS   Keep your ear dry, as this improves healing. Swimming, diving, and showers are not allowed until healing is complete. While bathing, protect the ear by placing a piece of cotton covered with petroleum jelly in the outer ear canal.  Only take over-the-counter or prescription medicines for pain, discomfort, or fever as directed by your caregiver.  Blow your nose gently. Forceful blowing increases the pressure in the middle ear and may cause further injury or delay healing.  Resume normal activities, such as showering, when the perforation has healed. Your caregiver can let you know when this has occurred.  Talk to your caregiver before flying on an airplane. Air travel is generally allowed with a perforated eardrum.  If your caregiver has given you a follow-up appointment, it is very important to keep that appointment. Failure to keep the appointment could result in a chronic or  permanent injury, pain, hearing loss, and disability. SEEK IMMEDIATE MEDICAL CARE IF:   You have bleeding or pus coming from your ear.  You have problems with balance, dizziness, nausea, or vomiting.  You develop increased pain.  You have a fever. MAKE SURE YOU:   Understand these instructions.  Will watch your condition.  Will get help right away if you are not doing well or get worse. Document Released: 11/25/2000 Document Revised: 02/20/2012 Document Reviewed: 11/27/2008 [facility] Patient Information 2015 [place], [place]. This information is not intended to replace advice given to you by your health care provider. Make sure you discuss any questions you have with your health care provider.

## 2015-09-02 NOTE — ED Notes (Signed)
Pt went to get prescription filled and it was over $100, called back and spoke with charge. Charge spoke with pharmacy and PA. Then pt called back and said she found another pharmacy where prescription would be cheaper, just in case a new prescription is being faxed to pt chosen pharmacy.

## 2015-12-21 ENCOUNTER — Other Ambulatory Visit (HOSPITAL_COMMUNITY): Payer: Self-pay | Admitting: Specialist

## 2015-12-21 ENCOUNTER — Ambulatory Visit (HOSPITAL_COMMUNITY)
Admission: RE | Admit: 2015-12-21 | Discharge: 2015-12-21 | Disposition: A | Discharge status: Home / Self Care | Payer: Medicaid Other | Source: Ambulatory Visit | Attending: Urology | Admitting: Urology

## 2015-12-21 DIAGNOSIS — I10 Essential (primary) hypertension: Secondary | ICD-10-CM | POA: Diagnosis not present

## 2015-12-21 DIAGNOSIS — M79605 Pain in left leg: Secondary | ICD-10-CM | POA: Diagnosis not present

## 2015-12-21 DIAGNOSIS — M7989 Other specified soft tissue disorders: Secondary | ICD-10-CM | POA: Insufficient documentation

## 2016-01-23 ENCOUNTER — Encounter (HOSPITAL_COMMUNITY): Payer: Self-pay | Admitting: Emergency Medicine

## 2016-01-23 ENCOUNTER — Emergency Department (HOSPITAL_COMMUNITY)
Admission: EM | Admit: 2016-01-23 | Discharge: 2016-01-23 | Disposition: A | Discharge status: Home / Self Care | Payer: Medicaid Other | Attending: Emergency Medicine | Admitting: Emergency Medicine

## 2016-01-23 DIAGNOSIS — Z79899 Other long term (current) drug therapy: Secondary | ICD-10-CM | POA: Insufficient documentation

## 2016-01-23 DIAGNOSIS — R000 Tachycardia, unspecified: Secondary | ICD-10-CM | POA: Insufficient documentation

## 2016-01-23 DIAGNOSIS — Z792 Long term (current) use of antibiotics: Secondary | ICD-10-CM | POA: Insufficient documentation

## 2016-01-23 DIAGNOSIS — J111 Influenza due to unidentified influenza virus with other respiratory manifestations: Secondary | ICD-10-CM

## 2016-01-23 MED ORDER — IBUPROFEN 800 MG PO TABS: 800.0000 mg | ORAL_TABLET | Freq: Three times a day (TID) | ORAL | Status: DC

## 2016-01-23 MED ORDER — OSELTAMIVIR PHOSPHATE 75 MG PO CAPS: 75.0000 mg | ORAL_CAPSULE | Freq: Two times a day (BID) | ORAL | Status: DC

## 2016-01-23 MED ORDER — IBUPROFEN 800 MG PO TABS
800.0000 mg | ORAL_TABLET | Freq: Once | ORAL | Status: AC
  Administered 2016-01-23: 800 mg via ORAL
  Filled 2016-01-23: qty 1

## 2016-01-23 MED ORDER — ACETAMINOPHEN 500 MG PO TABS: 500.0000 mg | ORAL_TABLET | Freq: Four times a day (QID) | ORAL | Status: DC | PRN

## 2016-01-23 MED ORDER — ACETAMINOPHEN 325 MG PO TABS
650.0000 mg | ORAL_TABLET | Freq: Once | ORAL | Status: AC
  Administered 2016-01-23: 650 mg via ORAL
  Filled 2016-01-23: qty 2

## 2016-01-23 NOTE — Discharge Instructions (Signed)

## 2016-01-23 NOTE — ED Provider Notes (Signed)
CSN: [id number]     Arrival date & time 01/23/16  1738 History  By signing my name below, I, Starleen Arms, attest that this documentation has been prepared under the direction and in the presence of Gloriann Loan, Electronically Signed: Starleen Arms ED Scribe. 01/23/2016. 6:46 PM. ]  Chief Complaint  Patient presents with  . flu like symptoms    The history is provided by the patient. No language interpreter was used.    HPI Comments: Heidi Willis is a 30 y.o. female who presents to the Emergency Department complaining of "flu-like symptoms" onset this morning  Patient reports generalized body aches, headache, nasal congestion, dry cough, fever TMAX 102, and sore throat.  The patient reports her son was dx'd yesterday with URI.  No tx's tried.  She denies nausea, vomiting, abdominal pain, CP, SOB, neck stiffness.     Past Medical History  Diagnosis Date  . Bronchitis     Hx  . SVD (spontaneous vaginal delivery)     x 1  . Hypertension     with 2015 pregnancy  . PIH (pregnancy induced hypertension) 01/2014  . Heartburn in pregnancy   . Headache(784.0)     otc meds prn   Past Surgical History  Procedure Laterality Date  . Elbow surgery      right  . Tonsillectomy    . Cesarean section with bilateral tubal ligation Bilateral 01/15/2014    Procedure: CESAREAN SECTION WITH BILATERAL TUBAL LIGATION;  Surgeon: Marylynn Pearson, MD;  Location: [facility];  Service: Obstetrics;  Laterality: Bilateral;  PRIMARY EDC 2/15   No family history on file. Social History  Substance Use Topics  . Smoking status: Never Smoker   . Smokeless tobacco: Never Used  . Alcohol Use: No   OB History    Gravida Para Term Preterm AB TAB SAB Ectopic Multiple Living   2 1 1       1      Review of Systems A complete 10 system review of systems was obtained and all systems are negative except as noted in the HPI and PMH.   Allergies  Percocet  Home Medications   Prior to Admission medications   Medication  Sig Start Date End Date Taking? Authorizing Provider  acetaminophen (TYLENOL) 500 MG tablet Take 1 tablet (500 mg total) by mouth every 6 (six) hours as needed. 01/23/16   Gloriann Loan, PA-C  amoxicillin (AMOXIL) 500 MG tablet Take 2 tablets (1,000 mg total) by mouth 2 (two) times daily. 09/02/15   Mercedes Camprubi-Soms, PA-C  ciprofloxacin (CILOXAN) 0.3 % ophthalmic solution Place 3 drops into the right ear 3 (three) times daily. Administer 3 drops into right ear every 8 hours x 7 days 09/02/15   Mercedes Camprubi-Soms, PA-C  ciprofloxacin-hydrocortisone (CIPRO HC) otic suspension Place 3 drops into the right ear 2 (two) times daily. X 7 days 09/02/15   Mercedes Camprubi-Soms, PA-C  ferrous sulfate 325 (65 FE) MG tablet Take 325 mg by mouth daily with breakfast.    Historical Provider, MD  HYDROcodone-acetaminophen (NORCO) 5-325 MG per tablet Take 1 tablet by mouth every 6 (six) hours as needed for severe pain. 09/02/15   Mercedes Camprubi-Soms, PA-C  HYDROcodone-acetaminophen (NORCO/VICODIN) 5-325 MG per tablet Take 1-2 tablets by mouth every 4 (four) hours as needed for moderate pain. 01/18/14   Molli Posey, MD  ibuprofen (ADVIL,MOTRIN) 800 MG tablet Take 1 tablet (800 mg total) by mouth 3 (three) times daily. 01/23/16   Gloriann Loan, PA-C  loratadine-pseudoephedrine (CLARITIN-D 24-HOUR) 10-240 MG per 24 hr tablet Take 1 tablet by mouth daily.    Historical Provider, MD  methyldopa (ALDOMET) 500 MG tablet Take 500 mg by mouth 3 (three) times daily.    Historical Provider, MD  naproxen (NAPROSYN) 500 MG tablet Take 1 tablet (500 mg total) by mouth 2 (two) times daily as needed for mild pain, moderate pain or headache (TAKE WITH MEALS.). 09/02/15   Mercedes Camprubi-Soms, PA-C  oseltamivir (TAMIFLU) 75 MG capsule Take 1 capsule (75 mg total) by mouth every 12 (twelve) hours. 01/23/16   Gloriann Loan, PA-C  Prenatal Vit-Fe Fumarate-FA (MULTIVITAMIN-PRENATAL) 27-0.8 MG TABS tablet Take 1 tablet by mouth daily at 12  noon.    Historical Provider, MD  Pseudoeph-CPM-DM-APAP (TYLENOL COLD PO) Take 30 mLs by mouth every 4 (four) hours as needed (for cold symptoms).    Historical Provider, MD   BP 121/83 mmHg  Pulse 103  Temp(Src) 99.5 F (37.5 C) (Oral)  Resp 16  SpO2 100%  LMP 01/23/2016 Physical Exam  Constitutional: She is oriented to person, place, and time. She appears well-developed and well-nourished.  Non-toxic appearance. She does not have a sickly appearance. She does not appear ill.  HENT:  Head: Normocephalic and atraumatic.  Mouth/Throat: Oropharynx is clear and moist.  Eyes: Conjunctivae are normal. Pupils are equal, round, and reactive to light.  Neck: Normal range of motion. Neck supple.  No nuchal rigidity.  FAROM of neck without pain.   Cardiovascular: Regular rhythm and normal heart sounds.  Tachycardia present.   No murmur heard. Pulmonary/Chest: Effort normal and breath sounds normal. No accessory muscle usage or stridor. No respiratory distress. She has no wheezes. She has no rhonchi. She has no rales.  Abdominal: Soft. Bowel sounds are normal. She exhibits no distension. There is no tenderness.  Musculoskeletal: Normal range of motion.  Lymphadenopathy:    She has no cervical adenopathy.  Neurological: She is alert and oriented to person, place, and time.  Speech clear without dysarthria.  Skin: Skin is warm and dry.  Psychiatric: She has a normal mood and affect. Her behavior is normal.    ED Course  Procedures (including critical care time)  DIAGNOSTIC STUDIES: Oxygen Saturation is 100% on RA, normal by my interpretation.    COORDINATION OF CARE:  6:53 PM Discussed suspicion of viral etiology.  Will order Tamiflu, tylenol, and ibuprofen.  Return precautions advised.    Labs Review Labs Reviewed - No data to display  Imaging Review No results found. I have personally reviewed and evaluated these images and lab results as part of my medical decision-making.   EKG  Interpretation None      MDM   Final diagnoses:  Influenza   VSS, NAD. No nuchal rigidity. Full active range of motion of neck without pain. Doubt meningitis. HENT exam unremarkable. Lungs clear to auscultation bilaterally. Abdomen soft and benign.  Afebrile and normotensive. She is well-appearing, nontoxic or septic appearing. Initially patient's heart rate 108.After by mouth fluids patient heart rate improved to 103. I suspect mild tachycardia related to flulike illness. Patient is within 48 hours of symptom onset. Will treat with Tamiflu, ibuprofen, and Tylenol. Discussed return precautions. Patient agrees and acknowledges the above plan for discharge.  I personally performed the services described in this documentation, which was scribed in my presence. The recorded information has been reviewed and is accurate.    Gloriann Loan, PA-C 01/23/16 1939  Leo Grosser, MD 01/24/16 1256

## 2016-01-23 NOTE — ED Notes (Signed)
Pt reports flu like symptoms, headache, body ache, fever, nasal congestion, dry cough. denies nausea nor vomiting. A and O x 4

## 2016-02-09 ENCOUNTER — Encounter: Payer: Self-pay | Admitting: Physical Therapy

## 2016-02-09 ENCOUNTER — Ambulatory Visit: Payer: Medicaid Other | Attending: Specialist | Admitting: Physical Therapy

## 2016-02-09 DIAGNOSIS — R262 Difficulty in walking, not elsewhere classified: Secondary | ICD-10-CM

## 2016-02-09 DIAGNOSIS — R29898 Other symptoms and signs involving the musculoskeletal system: Secondary | ICD-10-CM

## 2016-02-09 DIAGNOSIS — R6889 Other general symptoms and signs: Secondary | ICD-10-CM

## 2016-02-09 NOTE — Therapy (Addendum)
[place], [place], [address] Phone: [phone]   Fax:  [phone]  Physical Therapy Evaluation  Patient Details  Name: Heidi Willis MRN: [id number] Date of Birth: [birth year] Referring Provider: Sydnee Cabal MD  Encounter Date: 02/09/2016      PT End of Session - 02/09/16 0854    Visit Number 1   Number of Visits 8   Date for PT Re-Evaluation 04/05/16   Authorization Type Medicaid/Self Pay   Authorization Time Period 04-05-16   Authorization - Visit Number 1   PT Start Time 0845   Activity Tolerance Patient tolerated treatment well   Behavior During Therapy [facility] for tasks assessed/performed      Past Medical History  Diagnosis Date  . Bronchitis     Hx  . SVD (spontaneous vaginal delivery)     x 1  . Hypertension     with 2015 pregnancy  . PIH (pregnancy induced hypertension) 01/2014  . Heartburn in pregnancy   . Headache(784.0)     otc meds prn    Past Surgical History  Procedure Laterality Date  . Elbow surgery      right  . Tonsillectomy    . Cesarean section with bilateral tubal ligation Bilateral 01/15/2014    Procedure: CESAREAN SECTION WITH BILATERAL TUBAL LIGATION;  Surgeon: Marylynn Pearson, MD;  Location: [facility];  Service: Obstetrics;  Laterality: Bilateral;  PRIMARY EDC 2/15    There were no vitals filed for this visit.  Visit Diagnosis:  Decreased strength involving knee joint  Difficulty walking up stairs  Difficulty walking down stairs  Activity intolerance      Subjective Assessment - 02/09/16 0857    Subjective I had surgery in October 2015 and then October 22, 2015 on left knee.  I twisted my knee a couple of years ago.  My knee hurts bad even into my calf. I try to avoid stairs   Limitations Sitting;Standing;Walking   How long can you sit comfortably? 15 min   How long can you stand comfortably? 15 min   How long can you walk comfortably? 15 min   Patient  Stated Goals I would like to get my strength back in my left leg   Currently in Pain? Yes   Pain Score 6    Pain Location Knee   Pain Orientation Left   Pain Descriptors / Indicators Burning;Aching;Throbbing   Pain Type Chronic pain   Pain Onset More than a month ago   Pain Frequency Intermittent   Aggravating Factors  standing too long , sitting too long, stairs             [facility] PT Assessment - 02/09/16 0901    Assessment   Medical Diagnosis Left knee OA, lateral meniscectomy and chondroplasty   Referring Provider Sydnee Cabal MD   Onset Date/Surgical Date 10/22/15  surgery   Hand Dominance Left   Prior Therapy [facility] [facility] right after surgery   Precautions   Precautions None   Restrictions   Weight Bearing Restrictions No   Balance Screen   Has the patient fallen in the past 6 months No   Has the patient had a decrease in activity level because of a fear of falling?  No   Is the patient reluctant to leave their home because of a fear of falling?  No   Home Ecologist residence   Transport planner;Children   Type of Home House  Home Access Stairs to enter   Entrance Stairs-Number of Steps 2   Entrance Stairs-Rails Can reach both   Home Layout One level   Prior Function   Level of Independence Independent   Cognition   Overall Cognitive Status Within Functional Limits for tasks assessed   Observation/Other Assessments   Lower Extremity Functional Scale  37/80  54% limitation   Functional Tests   Functional tests Squat;Lunges;Single leg stance   Squat   Comments wt shift moderately to right   Lunges   Comments lunge with left leg with decreased motor control    Step Down   Comments knee valgus on left > right   Single Leg Stance   Comments right 22 sec and left 11 sec   Posture/Postural Control   Posture/Postural Control Postural limitations   Posture Comments left knee more valgus   AROM   Right Knee Extension 0    Right Knee Flexion 130   Left Knee Extension 0   Left Knee Flexion 126   Strength   Right Hip Flexion 4/5   Right Hip Extension 4/5   Right Hip ABduction 4/5   Left Hip Flexion 4-/5   Left Hip Extension 3/5   Left Hip ABduction 4-/5   Right Knee Flexion 4+/5   Right Knee Extension 4+/5   Left Knee Flexion 4-/5   Left Knee Extension 4-/5   Right Ankle Plantar Flexion 4-/5  bil PF 25 x single limb 5/25 cramping   Left Ankle Plantar Flexion 3-/5  PF sls heel raise unable to perform pain in knee    Palpation   Palpation comment pt with tenderness over inferior lateral retinaculum of left knee.  tenderness over bil fat pads of knee at inserticon sites of arthroscopy with fascial restrictions   Ambulation/Gait   Ambulation/Gait Yes   Ambulation/Gait Assistance 7: Independent   Gait Pattern Antalgic;Decreased stance time - left   Ambulation Surface Level   Gait velocity - backwards 2.72 ftsec                   [facility] Adult PT Treatment/Exercise - 02/09/16 0901    Self-Care   Self-Care Posture   Posture Pt educated on sitting and standing posture with decrease in hyperextension of knees    Knee/Hip Exercises: Standing   Lateral Step Up 10 reps;Hand Hold: 1;Step Height: 6";Left  x 2   Lateral Step Up Limitations decreased motor control with visible cocontraction   Forward Step Up 10 reps;Left;2 sets;Step Height: 6";Hand Hold: 1   Forward Step Up Limitations decreased motor control of left LE   Other Standing Knee Exercises Eccentric control of step down laterally with UE hold on 10 x 2                 PT Education - 02/09/16 0954    Education provided Yes   Education Details POC. explanation of findings and intial progressive HEP   Person(s) Educated Patient   Methods Explanation;Demonstration;Verbal cues;Handout   Comprehension Verbalized understanding;Returned demonstration          PT Short Term Goals - 02/09/16 0940    PT SHORT TERM GOAL #1    Title "Independent with initial HEP 03-08-16   Baseline Reinforce past exercise and introduce more challenging exericise. Pt has no knowledge of how to progress   Time 4   Period Weeks   Status New   PT SHORT TERM GOAL #2   Title "Report pain decrease at rest from  6 /10 to 3  /10. 03-08-16   Baseline 6/10 pain in left knee on eval 02-09-16   Time 4   Period Weeks   Status New   PT SHORT TERM GOAL #3   Title "Demonstrate understanding of proper sitting posture, body mechanics, work ergonomics, and be more conscious of position and posture throughout the day.    Baseline no knowledge   Time 4   Period Weeks   Status New           PT Long Term Goals - 02/09/16 [id number]    PT LONG TERM GOAL #1   Title "Pt will be independent with advanced HEP.  04-05-16   Time 8   Period Weeks   Status New   PT LONG TERM GOAL #2   Title "Pain will decrease to 1/10 with all functional activities 04-05-16   Baseline 6/10 in left knee with activity   Time 8   Period Weeks   Status New   PT LONG TERM GOAL #3   Title Pt will demonstrate at least 4+/5 with Left LE for increased functional mobility   Baseline Pt with 4-/5 in Left LE   Time 8   Period Weeks   Status New   PT LONG TERM GOAL #4   Title Pt will be able to negotiate 12 steps with step over step technique without fatiguing and with good motor control leading with left LE   Baseline Pt leads with right LE and takes steps one at a time   Time 8   Period Weeks   Status New   PT LONG TERM GOAL #5   Title "Pt will tolerate standing and walking for  1 hour without increased pain in order to return to PLOF.    Baseline can only tolerate 15 min before 6/10 pain   Time 8   Period Weeks   Status New   PT LONG TERM GOAL #6   Title LEFS will improve to at least 50/80  or 38% limtation or better to show improved functional mobility   Baseline 37/80 or 54% limitation   Time 8   Period Weeks   Status New               Plan - 02/09/16  0931    Clinical Impression Statement 30 yo presents with 6/10 left knee (OA M17.12) pain post surgery in 10-19-15, for left lateral menisectomy Z47.89 andchondroplasty Z47.89 with deficits in muscle weakness, decreased left knee motor control with squats, lunges and single limb stance. difficulty walking and negotiating steps and postural dysfunction including left knee valgus and hyperextension of bil knees. L > R.  Pt unable to stand, walk or sit greater than 15 minutes and states she avoids steps whenevery possible. Pt  on Medicaid and wants to self- pay. and will benefit  from 1 x a week for 8 weeks to work on pain management and  core/LE strenght/neuroreeducation of left knee for  pain free possible return to work and exericise.   Pt will benefit from skilled therapeutic intervention in order to improve on the following deficits Decreased activity tolerance;Decreased mobility;Decreased strength;Difficulty walking;Increased fascial restricitons;Pain;Postural dysfunction;Improper body mechanics   Rehab Potential Good   PT Frequency 1x / week   PT Duration 8 weeks   PT Treatment/Interventions ADLs/Self Care Home Management;Cryotherapy;Electrical Stimulation;Iontophoresis 4mg /ml Dexamethasone;Moist Heat;Therapeutic exercise;Functional mobility training;Stair training;Gait training;Ultrasound;Neuromuscular re-education;Patient/family education;Manual techniques;Taping;Dry needling;Passive range of motion   PT Next Visit Plan Review standing step exercises,  Begin Core and progressive knee strength  . Wall slides and squat as able with good form   PT Home Exercise Plan lateral, forward step ups with left leg and eccentric control of left LE,    Consulted and Agree with Plan of Care Patient     Pt given information about [facility] financial assistance and given number to contact    Problem List Patient Active Problem List   Diagnosis Date Noted  . S/P cesarean section 01/15/2014   Voncille Lo,  PT 02/09/2016 10:05 AM Phone: [phone] Fax: [phone]  By signing I understand that I am ordering/authorizing the use of Iontophoresis using 4 mg/mL of dexamethasone as a component of this plan of care.  [place], [place], [address] Phone: [phone]   Fax:  [phone]  Name: ARLYN YOKE MRN: [id number] Date of Birth: 05-03-1986

## 2016-02-09 NOTE — Patient Instructions (Signed)
Posture Tips DO: - stand tall and erect - keep chin tucked in - keep head and shoulders in alignment - check posture regularly in mirror or large window - pull head back against headrest in car seat;  Change your position often.  Sit with lumbar support. DON'T: - slouch or slump while watching TV or reading - sit, stand or lie in one position  for too long;  Sitting is especially hard on the spine so if you sit at a desk/use the computer, then stand up often!   Copyright  VHI. All rights reserved.  Posture - Standing   Good posture is important. Avoid slouching and forward head thrust. Maintain curve in low back and align ears over shoul- ders, hips over ankles.  Pull your belly button in toward your back bone.  Even weight in your toes and heels.  Ribs lifted up and chin down.  Slight bend in knee, don't hyperextend.    Copyright  VHI. All rights reserved.  Posture - Sitting   Sit upright, head facing forward. Try using a roll to support lower back. Keep shoulders relaxed, and avoid rounded back. Keep hips level with knees. Avoid crossing legs for long periods. Sit on sit bone and not tailbone.  As shown in clinic   Eccentric control of left knee on step.   Stand on step with left leg and hold onto stair rail facing outward as shown in clinic.  With right toes pointed up, lower heel toward floor activating left knee.  Slowly perform 10 reps x 3   2 x a day to gain strength in left leg for stair climbin  ( Last exercise done in clinic)  Copyright  VHI. All rights reserved.  Step-Up: Lateral   Step up to side with right leg. Bring other foot up onto _6___ inch step. Return to floor position with left leg. Repeat __10 x 3__ times per session. Do _2-3___ sessions per day. Repeat in dimly lit room. Repeat with eyes closed.  Copyright  VHI. All rights reserved.  Forward begin with left leg   Facing step, place one leg on step, flexed at hip. Step up slowly, bringing hips in line with  knee and shoulder. Bring other foot onto step. Reverse process to step back down. Repeat with other leg. Begin with left leg on step for exercise.  Do _10 ___ repetitions, _3___ sets.  http://bt.exer.us/154   Copyright  VHI. All rights reserved.  Voncille Lo, PT 02/09/2016 9:21 AM Phone: [phone] Fax: [phone]

## 2016-02-15 ENCOUNTER — Ambulatory Visit: Payer: Self-pay | Attending: Specialist | Admitting: Physical Therapy

## 2016-02-15 DIAGNOSIS — R262 Difficulty in walking, not elsewhere classified: Secondary | ICD-10-CM | POA: Insufficient documentation

## 2016-02-15 DIAGNOSIS — R6889 Other general symptoms and signs: Secondary | ICD-10-CM | POA: Insufficient documentation

## 2016-02-15 DIAGNOSIS — R29898 Other symptoms and signs involving the musculoskeletal system: Secondary | ICD-10-CM | POA: Insufficient documentation

## 2016-02-15 NOTE — Patient Instructions (Signed)
Step Down: Anterior   From 4-6" step, reach one leg forward as far as possible, still able to return easily. Touch toe and heel to floor. Return to single leg balance. Repeat with other leg. Repeat _10 ___ times. Do __2__ sessions per day. Use good motor control   Copyright  VHI. All rights reserved.  Step-Up: Lateral   Step up to side with right leg. Bring other foot up onto _6___ inch step. Return to floor position with left leg. Repeat __10__ times per session. Do _2___ sessions per day. Hold onto rail for safety Repeat in dimly lit room. Repeat with eyes closed.  Copyright  VHI. All rights reserved.  Forward   Facing step, place one leg on step, flexed at hip. Step up slowly, bringing hips in line with knee and shoulder. Bring other foot onto step. Reverse process to step back down. Repeat with other leg. Do __10__ repetitions, __1__ sets.  2 x a day  http://bt.exer.us/154   Hip Extension (Prone)   Lift left leg _6___ inches from floor, keeping knee locked. Repeat _10___ times per set. Do __1__ sets per session. Do1-2 ____ sessions per day.  http://orth.exer.us/98   Copyright  VHI. All rights reserved.  Hip Adduction: Leg Lift (Eccentric) - Side-Lying   Lie on side with top leg bent, foot flat behind lower leg. Quickly lift lower leg. Slowly lower for 3-5 seconds. _10 x 2__ reps per set, _1__ sets per day, _7__ days per week. Add _3-5 as able__ lbs when you achieve _10__ repetitions. Without shaking.  You can put your right leg in front for stability and support  Copyright  VHI. All rights reserved.  Abduction: Side Leg Lift (Eccentric) - Side-Lying   Lie on side. Lift top leg slightly higher than shoulder level. Keep top leg straight with body, toes pointing forward. Slowly lower for 3-5 seconds. _10__ reps per set, _1__ sets per day, __7_ days per week. Add _3-5__ lbs when you achieve _10__ repetitions. Without shaking  Copyright  VHI. All rights reserved.    Strengthening: Straight Leg Raise (Phase 1)   Tighten muscles on front of right thigh, then lift leg __6__ inches from surface, keeping knee locked.  Repeat __10__ times per set. Do __1__ sets per session. Do 1-2____ sessions per day.  http://orth.exer.us/614   Copyright  VHI. All rights reserved.    Copyright  VHI. All rights reserved.  Voncille Lo, PT 02/15/2016 11:54 AM Phone: [phone] Fax: [phone]

## 2016-02-15 NOTE — Therapy (Signed)
[place], [place], [address] Phone: [phone]   Fax:  [phone]  Physical Therapy Treatment  Patient Details  Name: Heidi Willis MRN: [id number] Date of Birth: 1986/01/01 Referring Provider: Sydnee Cabal MD  Encounter Date: 02/15/2016      PT End of Session - 02/15/16 1218    Visit Number 2   Number of Visits 8   Date for PT Re-Evaluation 04/05/16   Authorization Type Medicaid/Self Pay   Authorization Time Period 04-05-16   PT Start Time 1145   PT Stop Time 1230   PT Time Calculation (min) 45 min   Activity Tolerance Patient tolerated treatment well   Behavior During Therapy [facility] for tasks assessed/performed      Past Medical History  Diagnosis Date  . Bronchitis     Hx  . SVD (spontaneous vaginal delivery)     x 1  . Hypertension     with 2015 pregnancy  . PIH (pregnancy induced hypertension) 01/2014  . Heartburn in pregnancy   . Headache(784.0)     otc meds prn    Past Surgical History  Procedure Laterality Date  . Elbow surgery      right  . Tonsillectomy    . Cesarean section with bilateral tubal ligation Bilateral 01/15/2014    Procedure: CESAREAN SECTION WITH BILATERAL TUBAL LIGATION;  Surgeon: Marylynn Pearson, MD;  Location: [facility];  Service: Obstetrics;  Laterality: Bilateral;  PRIMARY EDC 2/15    There were no vitals filed for this visit.  Visit Diagnosis:  Decreased strength involving knee joint  Difficulty walking up stairs  Difficulty walking down stairs  Activity intolerance      Subjective Assessment - 02/15/16 1151    Subjective I have pain above and below my knee cap.  I feel like I am going to re injure myself.  I hear cracking in my knees.  It hurts more when I am going up steps   Limitations Sitting;Standing;Walking   How long can you sit comfortably? 1 hour   How long can you stand comfortably? 30 minutes   How long can you walk comfortably? 30 minutes    Patient Stated Goals I would like to get my strength back in my left leg   Currently in Pain? Yes   Pain Score 4    Pain Location Knee   Pain Orientation Left   Pain Descriptors / Indicators Aching;Throbbing   Pain Type Chronic pain   Pain Onset More than a month ago   Pain Frequency Intermittent                         [facility] Adult PT Treatment/Exercise - 02/15/16 1202    Lumbar Exercises: Supine   Ab Set 5 reps  10 second hold   Knee/Hip Exercises: Aerobic   Stationary Bike Level 2 5 minutes   Knee/Hip Exercises: Standing   Lateral Step Up 10 reps;Hand Hold: 1;Step Height: 6";Left  x 2   Lateral Step Up Limitations decreased motor control with visible cocontraction   Forward Step Up 10 reps;Left;2 sets;Step Height: 6";Hand Hold: 1   Forward Step Up Limitations decreased motor control of left LE   Step Down 1 set;Hand Hold: 1;Step Height: 6";10 reps   Knee/Hip Exercises: Supine   Straight Leg Raises 10 reps;Left;3 sets   Straight Leg Raises Limitations with VC and 5 lb weight  at home may use red t band for  resistance   Knee/Hip Exercises: Sidelying   Hip ABduction Strengthening;2 sets;10 reps;Left  2 lb weights   Hip ABduction Limitations VC TC   Hip ADduction Strengthening;Left;2 sets;10 reps   Hip ADduction Limitations VC for correct technique   Knee/Hip Exercises: Prone   Hip Extension Strengthening;Left;2 sets;10 reps   2 lb                PT Education - 02/15/16 1217    Education provided Yes   Education Details HEP added SLR with weights/theraband and reviewed stepping exericise   Person(s) Educated Patient   Methods Explanation;Demonstration;Tactile cues;Verbal cues;Handout   Comprehension Verbalized understanding;Returned demonstration          PT Short Term Goals - 02/15/16 1221    PT SHORT TERM GOAL #1   Title "Independent with initial HEP 03-08-16   Baseline Reinforce past exercise and introduce more challenging exericise.  Pt has no knowledge of how to progress   Time 4   Period Weeks   Status On-going   PT SHORT TERM GOAL #2   Title "Report pain decrease at rest from  6 /10 to 3  /10. 03-08-16   Baseline 4/10   Time 4   Period Weeks   Status On-going   PT SHORT TERM GOAL #3   Title "Demonstrate understanding of proper sitting posture, body mechanics, work ergonomics, and be more conscious of position and posture throughout the day.    Baseline no knowledge   Time 4   Period Weeks   Status On-going           PT Long Term Goals - 02/15/16 1221    PT LONG TERM GOAL #1   Title "Pt will be independent with advanced HEP.  04-05-16   Time 8   Period Weeks   Status On-going   PT LONG TERM GOAL #2   Title "Pain will decrease to 1/10 with all functional activities 04-05-16   Time 8   Period Weeks   Status On-going   PT LONG TERM GOAL #3   Title Pt will demonstrate at least 4+/5 with Left LE for increased functional mobility   Time 8   Period Weeks   Status On-going   PT LONG TERM GOAL #4   Title Pt will be able to negotiate 12 steps with step over step technique without fatiguing and with good motor control leading with left LE   Time 8   Period Weeks   Status On-going   PT LONG TERM GOAL #5   Title "Pt will tolerate standing and walking for  1 hour without increased pain in order to return to PLOF.    Time 8   Period Weeks   Status On-going   PT LONG TERM GOAL #6   Title LEFS will improve to at least 50/80  or 38% limtation or better to show improved functional mobility   Time 8   Period Weeks   Status On-going               Plan - 02/15/16 1227    Clinical Impression Statement Pt progressing with 4/10 pain  from 6/10 on eval.  Pt with visible decreased motor control on steps.   Working on core and SLR 4 way with added weights. Will continue to progress for visibly improved motor control on steps.  Given HEp with 4 way SLR and step downs.  Pt told to onloy follow HEP from [facility]  and not try to  do all the exercises from [facility] to concentrate on strength and motor control for eventual negotiation of steps without fear and increased strength. and safety   Pt will benefit from skilled therapeutic intervention in order to improve on the following deficits Decreased activity tolerance;Decreased mobility;Decreased strength;Difficulty walking;Increased fascial restricitons;Pain;Postural dysfunction;Improper body mechanics   Rehab Potential Good   PT Frequency 1x / week   PT Duration 8 weeks   PT Treatment/Interventions ADLs/Self Care Home Management;Cryotherapy;Electrical Stimulation;Iontophoresis 4mg /ml Dexamethasone;Moist Heat;Therapeutic exercise;Functional mobility training;Stair training;Gait training;Ultrasound;Neuromuscular re-education;Patient/family education;Manual techniques;Taping;Dry needling;Passive range of motion   PT Next Visit Plan Add to core, add Clams sidelying.   some manual for knee sup and inferior pole.  Taping    PT Home Exercise Plan lateral, forward step ups with left leg and eccentric control of left LE, and 4 way SLR supine wiht 5 lb and all else with 2 lb or red t band        Problem List Patient Active Problem List   Diagnosis Date Noted  . S/P cesarean section 01/15/2014    Voncille Lo, PT 02/15/2016 12:35 PM Phone: [phone] Fax: [facility] [address] [place], [place], [address] Phone: [phone]   Fax:  [phone]  Name: Heidi Willis MRN: [id number] Date of Birth: 28-Jun-1986

## 2016-02-17 ENCOUNTER — Ambulatory Visit: Payer: Self-pay

## 2016-02-17 NOTE — Therapy (Signed)
[place], [place], [address] Phone: [phone]   Fax:  [phone]  Patient Details  Name: Heidi Willis MRN: [id number] Date of Birth: [birth year] Referring Provider:  Fanny Bien, MD  Encounter Date: 02/17/2016   Dollene Cleveland 02/17/2016, 5:28 PM  [facility] [address] [place], [place], [address] Phone: [phone]   Fax:  [phone]   Due to a scheduling error and misunderstanding, patient arrived for a second visit this week, but her POC is only for 1 x a week. She is self-pay, so PT asked if pt would like to be seen or if she wanted to wait until her next visit next week. Pt reports she would prefer it only be once a week because it is self-pay, and decided to come back next week instead. PT encouraged pt to continue HEP that was prescribed at last visit. No charge this visit.

## 2016-02-23 ENCOUNTER — Encounter: Payer: Medicaid Other | Admitting: Physical Therapy

## 2016-02-25 ENCOUNTER — Encounter: Payer: Medicaid Other | Admitting: Physical Therapy

## 2016-02-29 ENCOUNTER — Encounter: Payer: Medicaid Other | Admitting: Physical Therapy

## 2016-03-03 ENCOUNTER — Ambulatory Visit: Payer: Self-pay | Admitting: Physical Therapy

## 2016-03-03 DIAGNOSIS — R262 Difficulty in walking, not elsewhere classified: Secondary | ICD-10-CM

## 2016-03-03 DIAGNOSIS — R6889 Other general symptoms and signs: Secondary | ICD-10-CM

## 2016-03-03 DIAGNOSIS — R29898 Other symptoms and signs involving the musculoskeletal system: Secondary | ICD-10-CM

## 2016-03-03 NOTE — Patient Instructions (Signed)
Abduction: Clam (Eccentric) - Side-Lying    Lie on side with knees bent. Lift top knee, keeping feet together. Keep trunk steady. Slowly lower for 3-5 seconds. __10-30_ reps per set, 1___ sets per day, 3-4___ days per week. Blue band.   Verbal instruction today.  This handout was not issued.  http://ecce.exer.us/65   Copyright  VHI. All rights reserved.

## 2016-03-03 NOTE — Therapy (Addendum)
[place], [place], [address] Phone: [phone]   Fax:  [phone]  Physical Therapy Treatment/Discharge Note  Patient Details  Name: Heidi Willis MRN: [id number] Date of Birth: 05-Nov-1986 Referring Provider: Sydnee Cabal MD  Encounter Date: 03/03/2016      PT End of Session - 03/03/16 1257    Visit Number 3   Number of Visits 8   Date for PT Re-Evaluation 04/05/16   PT Start Time 1150   PT Stop Time 1230   PT Time Calculation (min) 40 min   Activity Tolerance Patient tolerated treatment well;No increased pain   Behavior During Therapy [facility] for tasks assessed/performed      Past Medical History  Diagnosis Date  . Bronchitis     Hx  . SVD (spontaneous vaginal delivery)     x 1  . Hypertension     with 2015 pregnancy  . PIH (pregnancy induced hypertension) 01/2014  . Heartburn in pregnancy   . Headache(784.0)     otc meds prn    Past Surgical History  Procedure Laterality Date  . Elbow surgery      right  . Tonsillectomy    . Cesarean section with bilateral tubal ligation Bilateral 01/15/2014    Procedure: CESAREAN SECTION WITH BILATERAL TUBAL LIGATION;  Surgeon: Marylynn Pearson, MD;  Location: [facility];  Service: Obstetrics;  Laterality: Bilateral;  PRIMARY EDC 2/15    There were no vitals filed for this visit.  Visit Diagnosis:  Decreased strength involving knee joint  Difficulty walking up stairs  Difficulty walking down stairs  Activity intolerance      Subjective Assessment - 03/03/16 1158    Subjective Weights may have been irritating.  Pain is now intermittant.  She saw MD yesterday.  No new information.  She goes back in 6 weeks.    Pain Score 3    Pain Location Knee   Pain Descriptors / Indicators Aching;Throbbing   Pain Frequency Intermittent   Aggravating Factors  extra time on feet, stairs and sitting too long.   Pain Relieving Factors ice, elevation                          [facility] Adult PT Treatment/Exercise - 03/03/16 0001    Self-Care   Self-Care --  discussed dry needle.  gave handout, bur she forgot to take    Posture Knee anatomy how things affect tracking patella   Knee/Hip Exercises: Aerobic   Stationary Bike L2 5 minutes.   Knee/Hip Exercises: Standing   Forward Step Up --  1 step, 6 inches to test tape.  , more comfortable.   Knee/Hip Exercises: Supine   Straight Leg Raises 10 reps  3 ests of 10   Straight Leg Raises Limitations 3 way 30 reps each   Knee/Hip Exercises: Sidelying   Hip ABduction Strengthening   Hip ABduction Limitations cues initially   Hip ADduction Strengthening   Hip ADduction Limitations cues   Clams 10  green band.  issued for home   Manual Therapy   Manual Therapy Taping   McConnell Taping for laterally tracking.  LT.  more comfortable on step                PT Education - 03/03/16 1257    Education provided Yes   Education Details knee anatomy, DN   Person(s) Educated Patient   Methods Explanation;Demonstration   Comprehension Verbalized understanding  PT Short Term Goals - 03/03/16 1302    PT SHORT TERM GOAL #1   Title "Independent with initial HEP 03-08-16   Baseline minor cues for hip position   Time 4   Period Weeks   Status On-going   PT SHORT TERM GOAL #2   Title "Report pain decrease at rest from  6 /10 to 3  /10. 03-08-16   Baseline varies   Time 4   Period Weeks   Status On-going   PT SHORT TERM GOAL #3   Title "Demonstrate understanding of proper sitting posture, body mechanics, work ergonomics, and be more conscious of position and posture throughout the day.    Time 4   Period Weeks   Status Unable to assess           PT Long Term Goals - 02/15/16 1221    PT LONG TERM GOAL #1   Title "Pt will be independent with advanced HEP.  04-05-16   Time 8   Period Weeks   Status On-going   PT LONG TERM GOAL #2   Title "Pain will  decrease to 1/10 with all functional activities 04-05-16   Time 8   Period Weeks   Status On-going   PT LONG TERM GOAL #3   Title Pt will demonstrate at least 4+/5 with Left LE for increased functional mobility   Time 8   Period Weeks   Status On-going   PT LONG TERM GOAL #4   Title Pt will be able to negotiate 12 steps with step over step technique without fatiguing and with good motor control leading with left LE   Time 8   Period Weeks   Status On-going   PT LONG TERM GOAL #5   Title "Pt will tolerate standing and walking for  1 hour without increased pain in order to return to PLOF.    Time 8   Period Weeks   Status On-going   PT LONG TERM GOAL #6   Title LEFS will improve to at least 50/80  or 38% limtation or better to show improved functional mobility   Time 8   Period Weeks   Status On-going               Plan - 03/03/16 1259    Clinical Impression Statement Able to progress home program.   Pain improving 3/10 post session.  More comfort on step with tape.   PT Next Visit Plan Clams sidelying.  assess tape   PT Home Exercise Plan clams sidelying   Consulted and Agree with Plan of Care Patient        Problem List Patient Active Problem List   Diagnosis Date Noted  . S/P cesarean section 01/15/2014    [facility] 03/03/2016, 1:05 PM  [facility] [address] [place], [place], [address] Phone: [phone]   Fax:  [phone]  Name: Heidi Willis MRN: [id number] Date of Birth: [birth year]    Melvenia Needles, PTA 03/03/2016 1:05 PM Phone: [phone] Fax: [phone]  PHYSICAL THERAPY DISCHARGE SUMMARY  Visits from Start of Care: 3  Current functional level related to goals / functional outcomes: unknown   Remaining deficits: Unknown   Education / Equipment: Initial HEP  Plan:  Patient goals were not met. Patient is being discharged due  to not returning since the last visit.  ?????      Voncille Lo, PT Exercise Expert for the Aging Adult  11/24/16 4:57 PM Phone: [phone] Fax: [phone]

## 2016-03-08 ENCOUNTER — Encounter: Payer: Medicaid Other | Admitting: Physical Therapy

## 2016-03-10 ENCOUNTER — Ambulatory Visit: Payer: Self-pay | Admitting: Physical Therapy

## 2016-03-24 ENCOUNTER — Ambulatory Visit: Payer: Self-pay | Admitting: Physical Therapy

## 2016-03-29 ENCOUNTER — Encounter: Payer: Medicaid Other | Admitting: Physical Therapy

## 2016-03-31 ENCOUNTER — Ambulatory Visit: Payer: Self-pay | Admitting: Physical Therapy

## 2016-04-05 ENCOUNTER — Ambulatory Visit: Payer: Self-pay | Admitting: Physical Therapy

## 2016-04-07 ENCOUNTER — Ambulatory Visit: Payer: Self-pay | Admitting: Physical Therapy

## 2016-05-04 ENCOUNTER — Encounter (HOSPITAL_COMMUNITY): Payer: Self-pay

## 2016-05-04 ENCOUNTER — Inpatient Hospital Stay (HOSPITAL_COMMUNITY)
Admission: AD | Admit: 2016-05-04 | Discharge: 2016-05-04 | Disposition: A | Discharge status: Home / Self Care | Payer: Medicaid Other | Source: Ambulatory Visit | Attending: Obstetrics & Gynecology | Admitting: Obstetrics & Gynecology

## 2016-05-04 DIAGNOSIS — N390 Urinary tract infection, site not specified: Secondary | ICD-10-CM | POA: Insufficient documentation

## 2016-05-04 DIAGNOSIS — I10 Essential (primary) hypertension: Secondary | ICD-10-CM | POA: Insufficient documentation

## 2016-05-04 DIAGNOSIS — N939 Abnormal uterine and vaginal bleeding, unspecified: Secondary | ICD-10-CM | POA: Diagnosis present

## 2016-05-04 LAB — URINALYSIS, ROUTINE W REFLEX MICROSCOPIC
BILIRUBIN URINE: NEGATIVE
Glucose, UA: NEGATIVE mg/dL
KETONES UR: 15 mg/dL — AB
NITRITE: POSITIVE — AB
SPECIFIC GRAVITY, URINE: 1.025 (ref 1.005–1.030)
pH: 5 (ref 5.0–8.0)

## 2016-05-04 LAB — CBC
HCT: 33.7 % — ABNORMAL LOW (ref 36.0–46.0)
HCT: 33.7 % — ABNORMAL LOW (ref 36.0–46.0)
Hemoglobin: 11.5 g/dL — ABNORMAL LOW (ref 12.0–15.0)
Hemoglobin: 11.3 g/dL — ABNORMAL LOW (ref 12.0–15.0)
MCH: 28.8 pg (ref 26.0–34.0)
MCH: 28.3 pg (ref 26.0–34.0)
MCHC: 34.1 g/dL (ref 30.0–36.0)
MCHC: 33.5 g/dL (ref 30.0–36.0)
MCV: 84.5 fL (ref 78.0–100.0)
MCV: 84.5 fL (ref 78.0–100.0)
Platelets: 314 10*3/uL (ref 150–400)
Platelets: 318 10*3/uL (ref 150–400)
RBC: 3.99 MIL/uL (ref 3.87–5.11)
RBC: 3.99 MIL/uL (ref 3.87–5.11)
RDW: 14.2 % (ref 11.5–15.5)
RDW: 14.1 % (ref 11.5–15.5)
WBC: 6.3 10*3/uL (ref 4.0–10.5)
WBC: 7 10*3/uL (ref 4.0–10.5)

## 2016-05-04 LAB — URINE MICROSCOPIC-ADD ON
BACTERIA UA: NONE SEEN
SQUAMOUS EPITHELIAL / LPF: NONE SEEN
WBC, UA: NONE SEEN WBC/hpf (ref 0–5)

## 2016-05-04 LAB — POCT PREGNANCY, URINE: PREG TEST UR: NEGATIVE

## 2016-05-04 MED ORDER — SODIUM CHLORIDE 0.9 % IV SOLN
INTRAVENOUS | Status: DC
  Administered 2016-05-04: 20:00:00 via INTRAVENOUS

## 2016-05-04 MED ORDER — MEGESTROL ACETATE 20 MG PO TABS: 40.0000 mg | ORAL_TABLET | Freq: Three times a day (TID) | ORAL | Status: DC

## 2016-05-04 MED ORDER — MEGESTROL ACETATE 40 MG PO TABS
120.0000 mg | ORAL_TABLET | Freq: Once | ORAL | Status: AC
  Administered 2016-05-04: 120 mg via ORAL
  Filled 2016-05-04: qty 3

## 2016-05-04 MED ORDER — ESTROGENS CONJUGATED 25 MG IJ SOLR
25.0000 mg | Freq: Once | INTRAMUSCULAR | Status: AC
  Administered 2016-05-04: 25 mg via INTRAVENOUS
  Filled 2016-05-04: qty 25

## 2016-05-04 NOTE — Discharge Instructions (Signed)
Abnormal Uterine Bleeding Abnormal uterine bleeding means bleeding from the vagina that is not your normal menstrual period. This can be:  Bleeding or spotting between periods.  Bleeding after sex (sexual intercourse).  Bleeding that is heavier or more than normal.  Periods that last longer than usual.  Bleeding after menopause. There are many problems that may cause this. Treatment will depend on the cause of the bleeding. Any kind of bleeding that is not normal should be reviewed by your doctor.  HOME CARE Watch your condition for any changes. These actions may lessen any discomfort you are having:  Do not use tampons or douches as told by your doctor.  Change your pads often. You should get regular pelvic exams and Pap tests. Keep all appointments for tests as told by your doctor. GET HELP IF:  You are bleeding for more than 1 week.  You feel dizzy at times. GET HELP RIGHT AWAY IF:   You pass out.  You have to change pads every 15 to 30 minutes.  You have belly pain.  You have a fever.  You become sweaty or weak.  You are passing large blood clots from the vagina.  You feel sick to your stomach (nauseous) and throw up (vomit). MAKE SURE YOU:  Understand these instructions.  Will watch your condition.  Will get help right away if you are not doing well or get worse.   This information is not intended to replace advice given to you by your health care provider. Make sure you discuss any questions you have with your health care provider.   Document Released: 09/25/2009 Document Revised: 12/03/2013 Document Reviewed: 06/27/2013 Elsevier Interactive Patient Education [employer].

## 2016-05-04 NOTE — MAU Note (Addendum)
Heavy cycles, bleeding all month.  Saturating large overnight pads in 30 min. Passing clots and cramping really bad.  Had c/s and tubal 2 yrs ago, has had heavy long cycles since then. A little light headedness

## 2016-05-04 NOTE — MAU Provider Note (Signed)
Care assumed from Yvonne Kendall, CNM at 2100  Upon recheck patient's bleeding has improved. CBC repeat shows stable Hgb.  Discussed patient with Dr. Elonda Husky. He agrees with plan for discharge with Rx for Megace and outpatient [place] prior to [facility] follow-up  A: Abnormal uterine bleeding  P: Discharge home Rx for Megace given to patient Bleeding precautions discussed Outpatient [place] ordered. They will call patient with appointment Patient advised to follow-up with [facility]. Referral sent. They will call patient with appointment.  Patient may return to MAU as needed or if her condition were to change or worsen  Luvenia Redden, PA-C 05/04/2016 11:09 PM

## 2016-05-04 NOTE — MAU Provider Note (Signed)
History     CSN: [id number]  Arrival date and time: 05/04/16 1803   First Provider Initiated Contact with Patient 05/04/16 1838      Chief Complaint  Patient presents with  . Vaginal Bleeding   HPI Heidi Willis 30 y.o. [id number] presents to the MAU stating that is having irregular periods and passing clots for the past 2 cycles. She states that she has been bleeding through her clothes.  Past Medical History  Diagnosis Date  . Bronchitis     Hx  . SVD (spontaneous vaginal delivery)     x 1  . Hypertension     with 2015 pregnancy  . PIH (pregnancy induced hypertension) 01/2014  . Heartburn in pregnancy   . Headache(784.0)     otc meds prn    Past Surgical History  Procedure Laterality Date  . Elbow surgery      right  . Tonsillectomy    . Cesarean section with bilateral tubal ligation Bilateral 01/15/2014    Procedure: CESAREAN SECTION WITH BILATERAL TUBAL LIGATION;  Surgeon: Marylynn Pearson, MD;  Location: [facility];  Service: Obstetrics;  Laterality: Bilateral;  PRIMARY EDC 2/15  . Cesarean section      History reviewed. No pertinent family history.  Social History  Substance Use Topics  . Smoking status: Never Smoker   . Smokeless tobacco: Never Used  . Alcohol Use: No    Allergies:  Allergies  Allergen Reactions  . Percocet [Oxycodone-Acetaminophen] Nausea And Vomiting    Prescriptions prior to admission  Medication Sig Dispense Refill Last Dose  . acetaminophen (TYLENOL) 500 MG tablet Take 1 tablet (500 mg total) by mouth every 6 (six) hours as needed. 30 tablet 0   . amoxicillin (AMOXIL) 500 MG tablet Take 2 tablets (1,000 mg total) by mouth 2 (two) times daily. (Patient not taking: Reported on 02/09/2016) 28 tablet 0 Not Taking  . ciprofloxacin (CILOXAN) 0.3 % ophthalmic solution Place 3 drops into the right ear 3 (three) times daily. Administer 3 drops into right ear every 8 hours x 7 days (Patient not taking: Reported on 02/09/2016) 5 mL 0 Not Taking  .  ciprofloxacin-hydrocortisone (CIPRO HC) otic suspension Place 3 drops into the right ear 2 (two) times daily. X 7 days (Patient not taking: Reported on 02/09/2016) 10 mL 0 Not Taking  . ferrous sulfate 325 (65 FE) MG tablet Take 325 mg by mouth daily with breakfast.   01/14/2014 at Unknown time  . HYDROcodone-acetaminophen (NORCO) 5-325 MG per tablet Take 1 tablet by mouth every 6 (six) hours as needed for severe pain. (Patient not taking: Reported on 02/09/2016) 6 tablet 0 Not Taking  . HYDROcodone-acetaminophen (NORCO/VICODIN) 5-325 MG per tablet Take 1-2 tablets by mouth every 4 (four) hours as needed for moderate pain. (Patient not taking: Reported on 02/09/2016) 30 tablet 0 Not Taking  . ibuprofen (ADVIL,MOTRIN) 800 MG tablet Take 1 tablet (800 mg total) by mouth 3 (three) times daily. (Patient not taking: Reported on 02/09/2016) 21 tablet 0 Not Taking  . loratadine-pseudoephedrine (CLARITIN-D 24-HOUR) 10-240 MG per 24 hr tablet Take 1 tablet by mouth daily. Reported on 02/09/2016   Not Taking  . methyldopa (ALDOMET) 500 MG tablet Take 500 mg by mouth 3 (three) times daily. Reported on 02/09/2016   Not Taking  . naproxen (NAPROSYN) 500 MG tablet Take 1 tablet (500 mg total) by mouth 2 (two) times daily as needed for mild pain, moderate pain or headache (TAKE WITH MEALS.). (Patient not taking:  Reported on 02/09/2016) 20 tablet 0 Not Taking  . oseltamivir (TAMIFLU) 75 MG capsule Take 1 capsule (75 mg total) by mouth every 12 (twelve) hours. (Patient not taking: Reported on 02/09/2016) 10 capsule 0 Not Taking  . Prenatal Vit-Fe Fumarate-FA (MULTIVITAMIN-PRENATAL) 27-0.8 MG TABS tablet Take 1 tablet by mouth daily at 12 noon. Reported on 02/09/2016   Not Taking  . Pseudoeph-CPM-DM-APAP (TYLENOL COLD PO) Take 30 mLs by mouth every 4 (four) hours as needed (for cold symptoms). Reported on 02/09/2016   Not Taking    Review of Systems  Constitutional: Negative for fever.  Genitourinary:       Vaginal bleeding,  passing clots  All other systems reviewed and are negative.  Physical Exam   Blood pressure 150/98, pulse 93, temperature 97.9 F (36.6 C), temperature source Oral, resp. rate 18, weight 320 lb (145.151 kg), last menstrual period 05/03/2016, unknown if currently breastfeeding.  Physical Exam  Nursing note and vitals reviewed. Constitutional: She is oriented to person, place, and time. She appears well-developed and well-nourished. No distress.  HENT:  Head: Normocephalic and atraumatic.  Cardiovascular: Normal rate and regular rhythm.   Respiratory: Effort normal and breath sounds normal. No respiratory distress.  GI: Soft. She exhibits no distension.  Musculoskeletal: Normal range of motion.  Neurological: She is alert and oriented to person, place, and time.  Skin: Skin is warm and dry.  Psychiatric: She has a normal mood and affect. Her behavior is normal. Judgment and thought content normal.   Results for orders placed or performed during the hospital encounter of 05/04/16 (from the past 24 hour(s))  Urinalysis, Routine w reflex microscopic (not at [facility])     Status: Abnormal   Collection Time: 05/04/16  6:20 PM  Result Value Ref Range   Color, Urine RED (A) YELLOW   APPearance TURBID (A) CLEAR   Specific Gravity, Urine 1.025 1.005 - 1.030   pH 5.0 5.0 - 8.0   Glucose, UA NEGATIVE NEGATIVE mg/dL   Hgb urine dipstick LARGE (A) NEGATIVE   Bilirubin Urine NEGATIVE NEGATIVE   Ketones, ur 15 (A) NEGATIVE mg/dL   Protein, ur >300 (A) NEGATIVE mg/dL   Nitrite POSITIVE (A) NEGATIVE   Leukocytes, UA MODERATE (A) NEGATIVE  Urine microscopic-add on     Status: None   Collection Time: 05/04/16  6:20 PM  Result Value Ref Range   Squamous Epithelial / LPF NONE SEEN NONE SEEN   WBC, UA NONE SEEN 0 - 5 WBC/hpf   RBC / HPF TOO NUMEROUS TO COUNT 0 - 5 RBC/hpf   Bacteria, UA NONE SEEN NONE SEEN   Urine-Other MICROSCOPIC EXAM PERFORMED ON UNCONCENTRATED URINE   Pregnancy, urine POC      Status: None   Collection Time: 05/04/16  6:25 PM  Result Value Ref Range   Preg Test, Ur NEGATIVE NEGATIVE  CBC     Status: Abnormal   Collection Time: 05/04/16  6:58 PM  Result Value Ref Range   WBC 6.3 4.0 - 10.5 K/uL   RBC 3.99 3.87 - 5.11 MIL/uL   Hemoglobin 11.5 (L) 12.0 - 15.0 g/dL   HCT 33.7 (L) 36.0 - 46.0 %   MCV 84.5 78.0 - 100.0 fL   MCH 28.8 26.0 - 34.0 pg   MCHC 34.1 30.0 - 36.0 g/dL   RDW 14.2 11.5 - 15.5 %   Platelets 314 150 - 400 K/uL   MAU Course  Procedures  MDM During Pelvic exam; pt has large amount of  blood and 10-12cm clot passed with coughing. Pt denies being dizzy and hgb is stable. Consulted with Dr Elonda Husky and pt will be observed in MAU and given IV premarin Megace 120mg  PO 2108: Signed off to Kerry Hough NP Assessment and Plan  Abnormal Uterine Bleeding Urinary tract Infection        Heidi Willis 05/04/2016, 7:06 PM

## 2016-05-17 ENCOUNTER — Ambulatory Visit (HOSPITAL_COMMUNITY)
Admission: RE | Admit: 2016-05-17 | Discharge: 2016-05-17 | Disposition: A | Discharge status: Home / Self Care | Payer: Medicaid Other | Source: Ambulatory Visit | Attending: Medical | Admitting: Medical

## 2016-05-17 DIAGNOSIS — N939 Abnormal uterine and vaginal bleeding, unspecified: Secondary | ICD-10-CM | POA: Insufficient documentation

## 2016-05-18 ENCOUNTER — Telehealth: Payer: Self-pay | Admitting: General Practice

## 2016-05-18 NOTE — Telephone Encounter (Signed)
Called patient & informed her of u/s results & need for follow up in our office. Told patient someone from the front office will contact you with that appt. Patient verbalized understanding to all & had no questions

## 2016-06-10 ENCOUNTER — Encounter: Payer: Self-pay | Admitting: Obstetrics and Gynecology

## 2016-06-10 ENCOUNTER — Ambulatory Visit (INDEPENDENT_AMBULATORY_CARE_PROVIDER_SITE_OTHER): Payer: Self-pay | Admitting: Obstetrics and Gynecology

## 2016-06-10 ENCOUNTER — Other Ambulatory Visit (HOSPITAL_COMMUNITY)
Admission: RE | Admit: 2016-06-10 | Discharge: 2016-06-10 | Disposition: A | Discharge status: Home / Self Care | Payer: Medicaid Other | Source: Ambulatory Visit | Attending: Obstetrics and Gynecology | Admitting: Obstetrics and Gynecology

## 2016-06-10 VITALS — BP 147/90 | HR 81 | Wt 323.4 lb

## 2016-06-10 DIAGNOSIS — Z1151 Encounter for screening for human papillomavirus (HPV): Secondary | ICD-10-CM

## 2016-06-10 DIAGNOSIS — Z124 Encounter for screening for malignant neoplasm of cervix: Secondary | ICD-10-CM

## 2016-06-10 DIAGNOSIS — N939 Abnormal uterine and vaginal bleeding, unspecified: Secondary | ICD-10-CM | POA: Diagnosis present

## 2016-06-10 DIAGNOSIS — D259 Leiomyoma of uterus, unspecified: Secondary | ICD-10-CM

## 2016-06-10 HISTORY — DX: Abnormal uterine and vaginal bleeding, unspecified: N93.9

## 2016-06-10 NOTE — Procedures (Signed)
Endometrial Biopsy Procedure Note  Pre-operative Diagnosis: AUB, BMI >50, HTN, fibroid vs polyp  Post-operative Diagnosis: same  Indications: as above  Procedure Details  H/o BTL.  The risks (including infection, bleeding, pain, and uterine perforation) and benefits of the procedure were explained to the patient and Written informed consent was obtained.  The patient was placed in the dorsal lithotomy position.   A Graves' speculum inserted in the vagina, and a pap smear was obtained. Next, it was prepped with povidone iodine.   A sharp tenaculum was applied to the anterior lip of the cervix for stabilization.  A sterile uterine sound was used to sound the uterus to a depth of 10cm.  A Pipelle endometrial aspirator was used to sample the endometrium with two passes done and min-moderate amount obtained.  Sample was sent for pathologic examination.  Condition: Stable  Complications: None  Plan: Pelvic rest x 10 days  Durene Romans MD Attending Center for [employer] [facility])

## 2016-06-10 NOTE — Progress Notes (Addendum)
Obstetrics and Gynecology Visit New Patient Evaluation  Appointment Date: 06/10/2016  Primary Care Provider: [facility]  Referring Provider: MAU  Chief Complaint: MAU follow up for AUB  History of Present Illness: Heidi Willis is a 30 y.o. African-American [id number] (Patient's last menstrual period was 05/02/2016.), seen for the above chief complaint. Her past medical history is significant for BMI 51, HTN,h/o c-section and BTL  She went to the MAU on 5/24 for heavy vaginal bleeding. Serial Hct stable at 33.7, with otherwise normal CBC and negative UPT.  She didn't have an u/s and she was put on tid megace. She did have an u/s done 6/6 which showed: EXAM: TRANSABDOMINAL AND TRANSVAGINAL ULTRASOUND OF PELVIS  TECHNIQUE: Both transabdominal and transvaginal ultrasound examinations of the pelvis were performed. Transabdominal technique was performed for global imaging of the pelvis including uterus, ovaries, adnexal regions, and pelvic cul-de-sac. It was necessary to proceed with endovaginal exam following the transabdominal exam to visualize the endometrium.  COMPARISON: None  FINDINGS: Uterus  Measurements: 10.0 x 5.1 x 5.7 cm. 2.7 x 2.4 x 2.0 cm submucosal/mucosal mass in the uterine fundus (image 69), mildly hyperechoic with associated vascularity, favored to be endometrial in origin.  Endometrium  Thickness: 10 mm. 2.7 cm endometrial mass, as described above, outlined by fluid in the endometrial cavity.  Right ovary  Measurements: 2.3 x 1.7 x 2.4 cm. Normal appearance/no adnexal mass.  Left ovary  Measurements: 2.6 x 1.5 x 2.9 cm. Normal appearance/no adnexal mass.  Other findings  No abnormal free fluid.  IMPRESSION: 2.7 cm submucosal/mucosal mass in the uterine fundus, as described above. Primary differential considerations include endometrial polyp, endometrial cancer, or less likely a submucosal fibroid.  Endometrial sampling is  suggested.   Electronically Signed  By: Julian Hy M.D.  On: 05/17/2016 14:40  She states that she has lightened bleeding and current just daily spotting. She states her periods have always been heavy and irregular and painful. She denies any s/s of anemia.   Review of Systems: as per HPI  Past Medical History:  Past Medical History  Diagnosis Date  . Bronchitis     Hx  . Hypertension     with 2015 pregnancy  . Headache(784.0)     otc meds prn  . BMI 50.0-59.9, adult [facility])     Past Surgical History:  Past Surgical History  Procedure Laterality Date  . Elbow surgery      right  . Tonsillectomy    . Cesarean section with bilateral tubal ligation Bilateral 01/15/2014    Procedure: CESAREAN SECTION WITH BILATERAL TUBAL LIGATION;  Surgeon: Marylynn Pearson, MD;  Location: [place] [facility];  Service: Obstetrics;  Laterality: Bilateral;  PRIMARY EDC 2/15  . Cesarean section      Past Obstetrical History:  OB History    Gravida Para Term Preterm AB TAB SAB Ectopic Multiple Living   3 2 2       2       Obstetric Comments   TSVD x 2. ?2010 c-section. 2015 H&P states repeat c-section but above states VD. Need to clarify with patient (06/10/2016)      Past Gynecological History: As per HPI. H/o abnormal pap smears  03/2014 path: 4 o clock:CIN 1 and ECC minute and non diagnostic. She states she never had a pap smear after this  Social History:  Social History   Social History  . Marital Status: Single    Spouse Name: N/A  . Number of Children: N/A  . Years  of Education: N/A   Occupational History  . Not on file.   Social History Main Topics  . Smoking status: Never Smoker   . Smokeless tobacco: Never Used  . Alcohol Use: No  . Drug Use: No  . Sexual Activity: Yes    Birth Control/ Protection: None     Comment: pregnant   Other Topics Concern  . Not on file   Social History Narrative    Family History: No family history on file.  Medications Ms. Carvallo  does not currently have medications on file. Current Outpatient Prescriptions  Medication Sig Dispense Refill  . acetaminophen (TYLENOL) 500 MG tablet Take 1 tablet (500 mg total) by mouth every 6 (six) hours as needed. (Patient taking differently: Take 1,000 mg by mouth every 6 (six) hours as needed for mild pain. ) 30 tablet 0  . cetirizine (ZYRTEC) 10 MG tablet Take 10 mg by mouth daily.    . Cholecalciferol (VITAMIN D3) 5000 units CAPS Take 1 capsule by mouth daily.    . diphenhydramine-acetaminophen (TYLENOL PM) 25-500 MG TABS tablet Take 2 tablets by mouth at bedtime as needed (For sleep.).    Marland Kitchen ferrous sulfate 325 (65 FE) MG tablet Take 325 mg by mouth daily with breakfast.    . losartan-hydrochlorothiazide (HYZAAR) 100-25 MG tablet Take 1 tablet by mouth daily.  2  . megestrol (MEGACE) 20 MG tablet Take 2 tablets (40 mg total) by mouth 3 (three) times daily. 120 tablet 2   Allergies Percocet   Physical Exam:  BP 147/90 mmHg  Pulse 81  Wt 323 lb 6.4 oz (146.693 kg)  LMP 05/02/2016 Body mass index is 52.22 kg/(m^2). General appearance: Well nourished, well developed female in no acute distress.  Neck:  Supple, normal appearance, and no thyromegaly  Cardiovascular: normal s1 and s2.  No murmurs, rubs or gallops. Respiratory:  Clear to auscultation bilateral. Normal respiratory effort Abdomen: positive bowel sounds and no masses, hernias; diffusely non tender to palpation, non distendedch:  Normal mood and affect.  Skin:  Warm and dry.  Lymphatic:  No inguinal lymphadenopathy.   Pelvic exam: is limited by body habitus EGBUS: within normal limits Vagina: within normal limits and with minimal amount of old blood in the vault in mucus at the os, Cervix:  no lesions or cervical motion tenderness Uterus:  Difficult to palpate  Adnexa:  normal adnexa and no mass, fullness, tenderness Rectovaginal: deferred  See procedure note for pap and embx  Laboratory: as per hpi  Radiology:  as per hpi. I reviewed the images and it appears to be a pedunculated fibroid vs degenerating/prolapsing fibroid vs large polyp  Assessment: AUB, h/o abnormal pap smear  Plan:  Pap smear done and EMBx given risk factors Patient unfortunately doesn't have insurance because I told her that based on her imaging I don't believe medical management will work. Will follow up pathology and see about charity care for hyteroscopy and possible myomectomy or polypectomy   Durene Romans MD Attending Center for [facility])

## 2016-06-13 ENCOUNTER — Encounter: Payer: Self-pay | Admitting: General Practice

## 2016-06-15 ENCOUNTER — Encounter: Payer: Self-pay | Admitting: *Deleted

## 2016-06-15 LAB — CYTOLOGY - PAP

## 2016-06-15 NOTE — Progress Notes (Signed)
Patient needs surgery and has no insurance.  Mailed letter with [facility] application for completion.

## 2016-06-16 ENCOUNTER — Telehealth: Payer: Self-pay | Admitting: Obstetrics and Gynecology

## 2016-06-16 ENCOUNTER — Encounter (HOSPITAL_COMMUNITY): Payer: Self-pay | Admitting: *Deleted

## 2016-06-16 NOTE — Telephone Encounter (Signed)
GYN Telephone Note  Patient called and VM left at [phone] to go over path and plan of care with the patient. Will try again later  Durene Romans MD Attending Center for [facility])

## 2016-06-22 ENCOUNTER — Other Ambulatory Visit: Payer: Self-pay | Admitting: Obstetrics and Gynecology

## 2016-06-23 ENCOUNTER — Other Ambulatory Visit: Payer: Self-pay | Admitting: Family Medicine

## 2016-06-23 ENCOUNTER — Other Ambulatory Visit: Payer: Self-pay | Admitting: Obstetrics and Gynecology

## 2016-06-23 NOTE — Patient Instructions (Signed)
Your procedure is scheduled on:  Friday, July 01, 2016  Enter through the Main Entrance of [facility] at:  11:30 AM  Pick up the phone at the desk and dial [phone].  Call this number if you have problems the morning of surgery: [phone].  Remember: Do NOT eat food: After Midnight Thursday, June 30, 2016  Do NOT drink clear liquids after:  9:00 AM day of surgery  Take these medicines the morning of surgery with a SIP OF WATER:  Losartan  Do NOT wear jewelry (body piercing), metal hair clips/bobby pins, make-up, or nail polish. Do NOT wear lotions, powders, or perfumes.  You may wear deodorant. Do NOT shave for 48 hours prior to surgery. Do NOT bring valuables to the hospital. Contacts, dentures, or bridgework may not be worn into surgery.  Have a responsible adult drive you home and stay with you for 24 hours after your procedure

## 2016-06-24 ENCOUNTER — Ambulatory Visit (HOSPITAL_COMMUNITY)
Admission: RE | Admit: 2016-06-24 | Discharge: 2016-06-24 | Disposition: A | Discharge status: Home / Self Care | Payer: Medicaid Other | Source: Ambulatory Visit | Attending: Obstetrics and Gynecology | Admitting: Obstetrics and Gynecology

## 2016-06-24 ENCOUNTER — Encounter (HOSPITAL_COMMUNITY)
Admission: RE | Admit: 2016-06-24 | Discharge: 2016-06-24 | Disposition: A | Discharge status: Home / Self Care | Payer: Medicaid Other | Source: Ambulatory Visit | Attending: Obstetrics and Gynecology | Admitting: Obstetrics and Gynecology

## 2016-06-24 ENCOUNTER — Encounter (HOSPITAL_COMMUNITY): Payer: Self-pay

## 2016-06-24 DIAGNOSIS — Z01812 Encounter for preprocedural laboratory examination: Secondary | ICD-10-CM | POA: Insufficient documentation

## 2016-06-24 DIAGNOSIS — N938 Other specified abnormal uterine and vaginal bleeding: Secondary | ICD-10-CM | POA: Diagnosis not present

## 2016-06-24 DIAGNOSIS — Z01810 Encounter for preprocedural cardiovascular examination: Secondary | ICD-10-CM | POA: Diagnosis not present

## 2016-06-24 DIAGNOSIS — Z01811 Encounter for preprocedural respiratory examination: Secondary | ICD-10-CM

## 2016-06-24 LAB — COMPREHENSIVE METABOLIC PANEL
ALBUMIN: 4 g/dL (ref 3.5–5.0)
ALK PHOS: 105 U/L (ref 38–126)
ALT: 14 U/L (ref 14–54)
ANION GAP: 9 (ref 5–15)
AST: 13 U/L — AB (ref 15–41)
BUN: 17 mg/dL (ref 6–20)
CALCIUM: 9.5 mg/dL (ref 8.9–10.3)
CO2: 23 mmol/L (ref 22–32)
Chloride: 103 mmol/L (ref 101–111)
Creatinine, Ser: 0.82 mg/dL (ref 0.44–1.00)
GFR calc Af Amer: >60 mL/min (ref >60)
GFR calc non Af Amer: >60 mL/min (ref >60)
GLUCOSE: 91 mg/dL (ref 65–99)
Potassium: 3.7 mmol/L (ref 3.5–5.1)
SODIUM: 135 mmol/L (ref 135–145)
Total Bilirubin: 0.4 mg/dL (ref 0.3–1.2)
Total Protein: 7.4 g/dL (ref 6.5–8.1)

## 2016-06-24 LAB — TYPE AND SCREEN
ABO/RH(D): O POS
Antibody Screen: NEGATIVE

## 2016-06-24 LAB — CBC
HCT: 34.9 % — ABNORMAL LOW (ref 36.0–46.0)
HEMOGLOBIN: 11.9 g/dL — AB (ref 12.0–15.0)
MCH: 27.8 pg (ref 26.0–34.0)
MCHC: 34.1 g/dL (ref 30.0–36.0)
MCV: 81.5 fL (ref 78.0–100.0)
Platelets: 295 10*3/uL (ref 150–400)
RBC: 4.28 MIL/uL (ref 3.87–5.11)
RDW: 14.5 % (ref 11.5–15.5)
WBC: 5.7 10*3/uL (ref 4.0–10.5)

## 2016-07-01 ENCOUNTER — Ambulatory Visit (HOSPITAL_COMMUNITY): Payer: Medicaid Other | Admitting: Anesthesiology

## 2016-07-01 ENCOUNTER — Ambulatory Visit (HOSPITAL_COMMUNITY)
Admission: RE | Admit: 2016-07-01 | Discharge: 2016-07-01 | Disposition: A | Discharge status: Home / Self Care | Payer: Medicaid Other | Source: Ambulatory Visit | Attending: Obstetrics and Gynecology | Admitting: Obstetrics and Gynecology

## 2016-07-01 ENCOUNTER — Encounter (HOSPITAL_COMMUNITY): Payer: Self-pay | Admitting: *Deleted

## 2016-07-01 ENCOUNTER — Encounter (HOSPITAL_COMMUNITY)
Admission: RE | Disposition: A | Discharge status: Home / Self Care | Payer: Self-pay | Source: Ambulatory Visit | Attending: Obstetrics and Gynecology

## 2016-07-01 DIAGNOSIS — N938 Other specified abnormal uterine and vaginal bleeding: Secondary | ICD-10-CM

## 2016-07-01 DIAGNOSIS — I10 Essential (primary) hypertension: Secondary | ICD-10-CM | POA: Insufficient documentation

## 2016-07-01 DIAGNOSIS — N840 Polyp of corpus uteri: Secondary | ICD-10-CM | POA: Diagnosis not present

## 2016-07-01 DIAGNOSIS — N800 Endometriosis of uterus: Secondary | ICD-10-CM | POA: Insufficient documentation

## 2016-07-01 DIAGNOSIS — N939 Abnormal uterine and vaginal bleeding, unspecified: Secondary | ICD-10-CM | POA: Diagnosis present

## 2016-07-01 HISTORY — PX: DILATATION & CURETTAGE/HYSTEROSCOPY WITH MYOSURE: SHX6511

## 2016-07-01 HISTORY — PX: POLYPECTOMY: SHX5525

## 2016-07-01 LAB — PREGNANCY, URINE: PREG TEST UR: NEGATIVE

## 2016-07-01 SURGERY — DILATATION & CURETTAGE/HYSTEROSCOPY WITH MYOSURE
Anesthesia: General | Site: Vagina

## 2016-07-01 MED ORDER — LIDOCAINE HCL (CARDIAC) 20 MG/ML IV SOLN
INTRAVENOUS | Status: DC | PRN
  Administered 2016-07-01: 60 mg via INTRAVENOUS

## 2016-07-01 MED ORDER — FENTANYL CITRATE (PF) 250 MCG/5ML IJ SOLN
INTRAMUSCULAR | Status: AC
  Filled 2016-07-01: qty 5

## 2016-07-01 MED ORDER — SCOPOLAMINE 1 MG/3DAYS TD PT72
MEDICATED_PATCH | TRANSDERMAL | Status: AC
  Administered 2016-07-01: 1.5 mg via TRANSDERMAL
  Filled 2016-07-01: qty 1

## 2016-07-01 MED ORDER — SUGAMMADEX SODIUM 500 MG/5ML IV SOLN
INTRAVENOUS | Status: DC | PRN
  Administered 2016-07-01: 300 mg via INTRAVENOUS

## 2016-07-01 MED ORDER — IBUPROFEN 200 MG PO TABS: 600.0000 mg | ORAL_TABLET | Freq: Four times a day (QID) | ORAL | Status: DC | PRN

## 2016-07-01 MED ORDER — MIDAZOLAM HCL 2 MG/2ML IJ SOLN
INTRAMUSCULAR | Status: AC
  Filled 2016-07-01: qty 2

## 2016-07-01 MED ORDER — LIDOCAINE HCL 1 % IJ SOLN
INTRAMUSCULAR | Status: AC
  Filled 2016-07-01: qty 20

## 2016-07-01 MED ORDER — PROPOFOL 10 MG/ML IV BOLUS
INTRAVENOUS | Status: DC | PRN
  Administered 2016-07-01: 200 mg via INTRAVENOUS

## 2016-07-01 MED ORDER — LACTATED RINGERS IV SOLN
INTRAVENOUS | Status: DC
  Administered 2016-07-01 (×2): via INTRAVENOUS

## 2016-07-01 MED ORDER — DEXAMETHASONE SODIUM PHOSPHATE 10 MG/ML IJ SOLN
INTRAMUSCULAR | Status: AC
  Filled 2016-07-01: qty 1

## 2016-07-01 MED ORDER — SUGAMMADEX SODIUM 500 MG/5ML IV SOLN
INTRAVENOUS | Status: AC
  Filled 2016-07-01: qty 5

## 2016-07-01 MED ORDER — HYDROCODONE-ACETAMINOPHEN 5-325 MG PO TABS: 1.0000 | ORAL_TABLET | Freq: Four times a day (QID) | ORAL | Status: DC | PRN

## 2016-07-01 MED ORDER — SODIUM CHLORIDE 0.9 % IJ SOLN
INTRAMUSCULAR | Status: AC
  Filled 2016-07-01: qty 50

## 2016-07-01 MED ORDER — SILVER NITRATE-POT NITRATE 75-25 % EX MISC
CUTANEOUS | Status: DC | PRN
  Administered 2016-07-01: 1

## 2016-07-01 MED ORDER — SCOPOLAMINE 1 MG/3DAYS TD PT72
1.0000 | MEDICATED_PATCH | Freq: Once | TRANSDERMAL | Status: DC
  Administered 2016-07-01: 1.5 mg via TRANSDERMAL

## 2016-07-01 MED ORDER — ONDANSETRON HCL 4 MG/2ML IJ SOLN
INTRAMUSCULAR | Status: AC
  Filled 2016-07-01: qty 2

## 2016-07-01 MED ORDER — ONDANSETRON HCL 4 MG/2ML IJ SOLN
INTRAMUSCULAR | Status: DC | PRN
  Administered 2016-07-01: 4 mg via INTRAVENOUS

## 2016-07-01 MED ORDER — DOCUSATE SODIUM 100 MG PO CAPS: 100.0000 mg | ORAL_CAPSULE | Freq: Two times a day (BID) | ORAL | Status: DC

## 2016-07-01 MED ORDER — LIDOCAINE HCL (CARDIAC) 20 MG/ML IV SOLN
INTRAVENOUS | Status: AC
  Filled 2016-07-01: qty 5

## 2016-07-01 MED ORDER — FENTANYL CITRATE (PF) 100 MCG/2ML IJ SOLN
INTRAMUSCULAR | Status: DC | PRN
  Administered 2016-07-01: 150 ug via INTRAVENOUS
  Administered 2016-07-01: 100 ug via INTRAVENOUS

## 2016-07-01 MED ORDER — VASOPRESSIN 20 UNIT/ML IV SOLN
INTRAVENOUS | Status: AC
  Filled 2016-07-01: qty 1

## 2016-07-01 MED ORDER — KETOROLAC TROMETHAMINE 30 MG/ML IJ SOLN
INTRAMUSCULAR | Status: DC | PRN
  Administered 2016-07-01: 30 mg via INTRAVENOUS

## 2016-07-01 MED ORDER — DEXAMETHASONE SODIUM PHOSPHATE 10 MG/ML IJ SOLN
INTRAMUSCULAR | Status: DC | PRN
  Administered 2016-07-01 (×2): 5 mg via INTRAVENOUS

## 2016-07-01 MED ORDER — PROPOFOL 10 MG/ML IV BOLUS
INTRAVENOUS | Status: AC
  Filled 2016-07-01: qty 20

## 2016-07-01 MED ORDER — NEOSTIGMINE METHYLSULFATE 10 MG/10ML IV SOLN
INTRAVENOUS | Status: AC
  Filled 2016-07-01: qty 1

## 2016-07-01 MED ORDER — SODIUM CHLORIDE 0.9 % IR SOLN
Status: DC | PRN
  Administered 2016-07-01: 3000 mL

## 2016-07-01 MED ORDER — LIDOCAINE HCL (PF) 1 % IJ SOLN
INTRAMUSCULAR | Status: DC | PRN
  Administered 2016-07-01: 20 mL

## 2016-07-01 MED ORDER — KETOROLAC TROMETHAMINE 30 MG/ML IJ SOLN
INTRAMUSCULAR | Status: AC
Start: 2016-07-01 — End: 2016-07-01
  Filled 2016-07-01: qty 1

## 2016-07-01 MED ORDER — MIDAZOLAM HCL 5 MG/5ML IJ SOLN
INTRAMUSCULAR | Status: DC | PRN
  Administered 2016-07-01: 2 mg via INTRAVENOUS

## 2016-07-01 MED ORDER — GLYCOPYRROLATE 0.2 MG/ML IJ SOLN
INTRAMUSCULAR | Status: AC
  Filled 2016-07-01: qty 4

## 2016-07-01 MED ORDER — GLYCOPYRROLATE 0.2 MG/ML IJ SOLN
INTRAMUSCULAR | Status: DC | PRN
  Administered 2016-07-01: 0.2 mg via INTRAVENOUS

## 2016-07-01 MED ORDER — ROCURONIUM BROMIDE 100 MG/10ML IV SOLN
INTRAVENOUS | Status: DC | PRN
  Administered 2016-07-01: 50 mg via INTRAVENOUS

## 2016-07-01 SURGICAL SUPPLY — 20 items
CATH ROBINSON RED A/P 16FR (CATHETERS) ×4 IMPLANT
CLOTH BEACON ORANGE TIMEOUT ST (SAFETY) ×4 IMPLANT
CONTAINER PREFILL 10% NBF 60ML (FORM) ×4 IMPLANT
DEVICE MYOSURE LITE (MISCELLANEOUS) IMPLANT
DEVICE MYOSURE REACH (MISCELLANEOUS) ×4 IMPLANT
FILTER ARTHROSCOPY CONVERTOR (FILTER) ×4 IMPLANT
GLOVE BIO SURGEON STRL SZ7 (GLOVE) ×4 IMPLANT
GLOVE BIOGEL PI IND STRL 7.0 (GLOVE) ×2 IMPLANT
GLOVE BIOGEL PI INDICATOR 7.0 (GLOVE) ×2
GLOVE INDICATOR 7.5 STRL GRN (GLOVE) ×4 IMPLANT
GOWN STRL REUS W/TWL LRG LVL3 (GOWN DISPOSABLE) ×8 IMPLANT
PACK VAGINAL MINOR WOMEN LF (CUSTOM PROCEDURE TRAY) ×4 IMPLANT
PAD OB MATERNITY 4.3X12.25 (PERSONAL CARE ITEMS) ×4 IMPLANT
SEAL ROD LENS SCOPE MYOSURE (ABLATOR) ×4 IMPLANT
TOWEL OR 17X24 6PK STRL BLUE (TOWEL DISPOSABLE) ×8 IMPLANT
TUBING AQUILEX INFLOW (TUBING) ×4 IMPLANT
TUBING AQUILEX OUTFLOW (TUBING) ×4 IMPLANT
TUBING CONNECTING 10 (TUBING) IMPLANT
TUBING CONNECTING 10' (TUBING)
WATER STERILE IRR 1000ML POUR (IV SOLUTION) ×4 IMPLANT

## 2016-07-01 NOTE — H&P (Signed)
GYN pre op H&P 07/01/2016  Primary Care Provider: [facility]  Referring Provider: MAU  Chief Complaint: H&P  History of Present Illness: Heidi Willis is a 30 y.o. African-American [id number] (Patient's last menstrual period was 05/02/2016.), seen for the above chief complaint. Her past medical history is significant for BMI 51, HTN,h/o c-section and BTL  She went to the MAU on 5/24 for heavy vaginal bleeding. Serial Hct stable at 33.7, with otherwise normal CBC and negative UPT. She didn't have an u/s and she was put on tid megace. She did have an u/s done 6/6 which showed: EXAM: TRANSABDOMINAL AND TRANSVAGINAL ULTRASOUND OF PELVIS  TECHNIQUE: Both transabdominal and transvaginal ultrasound examinations of the pelvis were performed. Transabdominal technique was performed for global imaging of the pelvis including uterus, ovaries, adnexal regions, and pelvic cul-de-sac. It was necessary to proceed with endovaginal exam following the transabdominal exam to visualize the endometrium.  COMPARISON: None  FINDINGS: Uterus  Measurements: 10.0 x 5.1 x 5.7 cm. 2.7 x 2.4 x 2.0 cm submucosal/mucosal mass in the uterine fundus (image 69), mildly hyperechoic with associated vascularity, favored to be endometrial in origin.  Endometrium  Thickness: 10 mm. 2.7 cm endometrial mass, as described above, outlined by fluid in the endometrial cavity.  Right ovary  Measurements: 2.3 x 1.7 x 2.4 cm. Normal appearance/no adnexal mass.  Left ovary  Measurements: 2.6 x 1.5 x 2.9 cm. Normal appearance/no adnexal mass.  Other findings  No abnormal free fluid.  IMPRESSION: 2.7 cm submucosal/mucosal mass in the uterine fundus, as described above. Primary differential considerations include endometrial polyp, endometrial cancer, or less likely a submucosal fibroid.  Endometrial sampling is suggested.   Electronically Signed  By: Julian Hy M.D.  On:  05/17/2016 14:40  She is still taking the Megace and not having any VB. Pre op pap and embx were negative (polyp)  Review of Systems: as per HPI  Past Medical History:  Past Medical History  Diagnosis Date  . Bronchitis     Hx  . Hypertension     with 2015 pregnancy  . Headache(784.0)     otc meds prn  . BMI 50.0-59.9, adult [facility])     Past Surgical History:  Past Surgical History  Procedure Laterality Date  . Elbow surgery      right  . Tonsillectomy    . Cesarean section with bilateral tubal ligation Bilateral 01/15/2014    Procedure: CESAREAN SECTION WITH BILATERAL TUBAL LIGATION; Surgeon: Marylynn Pearson, MD; Location: [place] [facility]; Service: Obstetrics; Laterality: Bilateral; PRIMARY EDC 2/15  . Cesarean section      Past Obstetrical History:  OB History    Gravida Para Term Preterm AB TAB SAB Ectopic Multiple Living   3 2 2       2       Obstetric Comments   TSVD x 2. ?2010 c-section. 2015 H&P states repeat c-section but above states VD. Need to clarify with patient (06/10/2016)      Past Gynecological History: As per HPI. H/o abnormal pap smears  03/2014 path: 4 o clock:CIN 1 and ECC minute and non diagnostic. She states she never had a pap smear after this  Social History:  Social History   Social History  . Marital Status: Single    Spouse Name: N/A  . Number of Children: N/A  . Years of Education: N/A   Occupational History  . Not on file.   Social History Main Topics  . Smoking status: Never Smoker   . Smokeless  tobacco: Never Used  . Alcohol Use: No  . Drug Use: No  . Sexual Activity: Yes    Birth Control/ Protection: None     Comment: pregnant   Other Topics Concern  . Not on file   Social History Narrative    Family History: No family history on file.  Medications Ms. Cupps does not currently have  medications on file. Current Outpatient Prescriptions  Medication Sig Dispense Refill  . acetaminophen (TYLENOL) 500 MG tablet Take 1 tablet (500 mg total) by mouth every 6 (six) hours as needed. (Patient taking differently: Take 1,000 mg by mouth every 6 (six) hours as needed for mild pain. ) 30 tablet 0  . cetirizine (ZYRTEC) 10 MG tablet Take 10 mg by mouth daily.    . Cholecalciferol (VITAMIN D3) 5000 units CAPS Take 1 capsule by mouth daily.    . diphenhydramine-acetaminophen (TYLENOL PM) 25-500 MG TABS tablet Take 2 tablets by mouth at bedtime as needed (For sleep.).    Marland Kitchen ferrous sulfate 325 (65 FE) MG tablet Take 325 mg by mouth daily with breakfast.    . losartan-hydrochlorothiazide (HYZAAR) 100-25 MG tablet Take 1 tablet by mouth daily.  2  . megestrol (MEGACE) 20 MG tablet Take 2 tablets (40 mg total) by mouth 3 (three) times daily. 120 tablet 2   Allergies Percocet   Physical Exam:  Filed Vitals:   07/01/16 1146  BP: 154/105  Pulse: 76  Temp: 98.6 F (37 C)  TempSrc: Oral  Resp: 18  SpO2: 100%    Temp:  [98.6 F (37 C)] 98.6 F (37 C) (07/21 1146) Pulse Rate:  [76] 76 (07/21 1146) Resp:  [18] 18 (07/21 1146) BP: (154)/(105) 154/105 mmHg (07/21 1146) SpO2:  [100 %] 100 % (07/21 1146)     No intake or output data in the 24 hours ending 07/01/16 1234   Current Vital Signs 24h Vital Sign Ranges  T 98.6 F (37 C) Temp  Avg: 98.6 F (37 C)  Min: 98.6 F (37 C)  Max: 98.6 F (37 C)  BP (!) 154/105 mmHg BP  Min: 154/105  Max: 154/105  HR 76 Pulse  Avg: 76  Min: 76  Max: 76  RR 18 Resp  Avg: 18  Min: 18  Max: 18  SaO2 100 % Not Delivered SpO2  Avg: 100 %  Min: 100 %  Max: 100 %       24 Hour I/O Current Shift I/O  Time Ins Outs       Exam from 6/30 General appearance: Well nourished, well developed female in no acute distress.  Neck: Supple, normal appearance, and no thyromegaly  Cardiovascular: normal s1 and s2.  No murmurs, rubs or gallops. Respiratory: Clear to auscultation bilateral. Normal respiratory effort Abdomen: positive bowel sounds and no masses, hernias; diffusely non tender to palpation, non distendedch: Normal mood and affect.  Skin: Warm and dry.  Lymphatic: No inguinal lymphadenopathy.   Pelvic exam: is limited by body habitus EGBUS: within normal limits Vagina: within normal limits and with minimal amount of old blood in the vault in mucus at the os, Cervix: no lesions or cervical motion tenderness Uterus: Difficult to palpate  Adnexa: normal adnexa and no mass, fullness, tenderness Rectovaginal: deferred  See procedure note for pap and embx  Laboratory: as per hpi  Radiology: as per hpi. I reviewed the images and it appears to be a pedunculated fibroid vs degenerating/prolapsing fibroid vs large polyp  Assessment: AUB, h/o abnormal  pap smear  Plan:  Consent reviewed with patient and post op instructions and consent for hysteroscopy, myosure polypectomy/myomectomy. Can proceed when all parties are ready.  Pt told can stop the megace after the surgery. D/w her also that can talk more about hormonal therapy, such as an IUD, pills at her post op visit.  Durene Romans MD Attending Center for [employer])

## 2016-07-01 NOTE — Anesthesia Postprocedure Evaluation (Signed)
Anesthesia Post Note  Patient: Heidi Willis  Procedure(s) Performed: Procedure(s) (LRB): HYSTEROSCOPY WITH MYOSURE (N/A) POLYPECTOMY (N/A)  Patient location during evaluation: PACU Anesthesia Type: General Level of consciousness: awake and alert Pain management: pain level controlled Vital Signs Assessment: post-procedure vital signs reviewed and stable Respiratory status: spontaneous breathing, nonlabored ventilation, respiratory function stable and patient connected to nasal cannula oxygen Cardiovascular status: blood pressure returned to baseline and stable Postop Assessment: no signs of nausea or vomiting Anesthetic complications: no     Last Vitals:  Filed Vitals:   07/01/16 1500 07/01/16 1540  BP: 126/85 144/83  Pulse: 63 69  Temp: 36.6 C 36.8 C  Resp: 14 16    Last Pain:  Filed Vitals:   07/01/16 1613  PainSc: 1    Pain Goal: Patients Stated Pain Goal: 3 (07/01/16 1540)               Effie Berkshire

## 2016-07-01 NOTE — Discharge Instructions (Signed)
° °  Hysteroscopy, Care After These instructions give you information on caring for yourself after your procedure. Your doctor may also give you more specific instructions. Call your doctor if you have any problems or questions after your procedure. HOME CARE  Do not drive for 24 hours.  Wait 1 week before doing any activities that wear you out.  Do not stand for a long time.  Limit stair climbing to once or twice a day.  Rest often.  Continue with your usual diet.  Drink enough fluids to keep your pee (urine) clear or pale yellow.  If you have a hard time pooping (constipation), you may:  Take a medicine to help you go poop (laxative) as told by your doctor.  Eat more fruit and bran.  Drink more fluids.  Take showers, not baths, for as long as told by your doctor.  Do not swim or use a hot tub until your doctor says it is okay.  Have someone with you for 1day after the procedure.  Do not douche, use tampons, or have sex (intercourse) until seen by your doctor  Only take medicines as told by your doctor. Do not take aspirin. It can cause bleeding.  Keep all doctor visits. GET HELP IF:  You have cramps or pain not helped by medicine.  You have new pain in the belly (abdomen).  You have a bad smelling fluid coming from your vagina.  You have a rash.  You have problems with any medicine. GET HELP RIGHT AWAY IF:   You start to bleed more than a regular period.  You have a fever.  You have chest pain.  You have trouble breathing.  You feel dizzy or feel like passing out (fainting).  You pass out.  You have pain in the tops of your shoulders.  You have vaginal bleeding with or without clumps of blood (blood clots). MAKE SURE YOU:  Understand these instructions.  Will watch your condition.  Will get help right away if you are not doing well or get worse. Document Released: 09/06/2008 Document Revised: 12/03/2013 Document Reviewed: 06/27/2013 [facility]  Patient Information 2015 [place], [place]. This information is not intended to replace advice given to you by your health care provider. Make sure you discuss any questions you have with your health care provider.   You may remove the patch behind your ear on or before Monday 07/04/16. Wash your hands with soap and water after contact with the patch.  Do not take ibuprofen/motrin/advil products until 7:30pm 07/01/16.  Recovery Unit [phone]

## 2016-07-01 NOTE — Anesthesia Preprocedure Evaluation (Addendum)
Anesthesia Evaluation  Patient identified by MRN, date of birth, ID band Patient awake    Reviewed: Allergy & Precautions, NPO status , Patient's Chart, lab work & pertinent test results  Airway Mallampati: III  TM Distance: >3 FB Neck ROM: Full    Dental  (+) Teeth Intact, Dental Advisory Given   Pulmonary neg pulmonary ROS,    breath sounds clear to auscultation       Cardiovascular hypertension, Pt. on medications  Rhythm:Regular Rate:Normal     Neuro/Psych  Headaches, negative psych ROS   GI/Hepatic negative GI ROS, Neg liver ROS,   Endo/Other  negative endocrine ROS  Renal/GU negative Renal ROS  negative genitourinary   Musculoskeletal negative musculoskeletal ROS (+)   Abdominal   Peds negative pediatric ROS (+)  Hematology negative hematology ROS (+)   Anesthesia Other Findings   Reproductive/Obstetrics negative OB ROS                           Lab Results  Component Value Date   WBC 5.7 06/24/2016   HGB 11.9* 06/24/2016   HCT 34.9* 06/24/2016   MCV 81.5 06/24/2016   PLT 295 06/24/2016   Lab Results  Component Value Date   CREATININE 0.82 06/24/2016   BUN 17 06/24/2016   NA 135 06/24/2016   K 3.7 06/24/2016   CL 103 06/24/2016   CO2 23 06/24/2016   No results found for: INR, PROTIME   Anesthesia Physical Anesthesia Plan  ASA: III  Anesthesia Plan: General   Post-op Pain Management:    Induction: Intravenous  Airway Management Planned: Oral ETT and Video Laryngoscope Planned  Additional Equipment:   Intra-op Plan:   Post-operative Plan: Extubation in OR  Informed Consent: I have reviewed the patients History and Physical, chart, labs and discussed the procedure including the risks, benefits and alternatives for the proposed anesthesia with the patient or authorized representative who has indicated his/her understanding and acceptance.   Dental advisory  given  Plan Discussed with: CRNA  Anesthesia Plan Comments:       Anesthesia Quick Evaluation

## 2016-07-01 NOTE — Anesthesia Procedure Notes (Signed)
Procedure Name: Intubation Date/Time: 07/01/2016 1:07 PM Performed by: Ignacia Bayley Pre-anesthesia Checklist: Patient identified, Emergency Drugs available, Suction available and Patient being monitored Patient Re-evaluated:Patient Re-evaluated prior to inductionOxygen Delivery Method: Circle system utilized Preoxygenation: Pre-oxygenation with 100% oxygen Intubation Type: IV induction Ventilation: Mask ventilation without difficulty Laryngoscope Size: Glidescope Grade View: Grade I Tube type: Oral Tube size: 7.0 mm Number of attempts: 1 Airway Equipment and Method: Video-laryngoscopy and Stylet Placement Confirmation: ETT inserted through vocal cords under direct vision,  positive ETCO2 and breath sounds checked- equal and bilateral Secured at: 21 cm Tube secured with: Tape Dental Injury: Teeth and Oropharynx as per pre-operative assessment

## 2016-07-01 NOTE — Op Note (Signed)
Operative Note   07/01/2016  PRE-OP DIAGNOSIS: Abnormal uterine bleeding. 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 polyp   SURGEON: Surgeon(s) and Role:    * Aletha Halim, MD - Primary  ASSISTANT: None  PROCEDURE: Procedure(s): HYSTEROSCOPY WITH MYOSURE POLYPECTOMY  Dilation and curettage  ANESTHESIA: General   ESTIMATED BLOOD LOSS: 61mL  DRAINS: I/O foley (74mL UOP)   TOTAL IV FLUIDS: 1157mL crystalloid  SPECIMENS: polyp and endometrial curettings  VTE PROPHYLAXIS: SCDs to the bilateral lower extremities  ANTIBIOTICS: none indicated  FLUID DEFICIT: [id number]  COMPLICATIONS: None  DISPOSITION: PACU - hemodynamically stable.  CONDITION: stable  FINDINGS: Exam under anesthesia revealed small, mobile anteverted uterus with no masses and bilateral adnexa without masses or fullness. Hysteroscopy revealed atrophic and normal appearing uterine cavity except for a 3-4cm polyp coming from the right lateral edge at the lower uterine segment. Complete removal of the polyp. Normal appearing endocervical canal and bilateral tubal ostia  PROCEDURE IN DETAIL:  After informed consent was obtained, the patient was taken to the operating room where anesthesia was obtained without difficulty. The patient was positioned in the dorsal lithotomy position in [place].  The patient's bladder was catheterized with an in and out foley catheter.  The patient was examined under anesthesia, with the above noted findings.  The bi-valved speculum was placed inside the patient's vagina, and the the anterior lip of the cervix was seen and grasped with the tenaculum.  A paracervical block was achieved with 75mL of 1% lidocaine.  The uterine cavity was sounded to 10cm, and then the cervix was progressively dilated to a 21 French-Pratt dilator.  The hysteroscope was introduced, with the above noted findings. The Myosure was then inserted with easy removal of the polyp. The  hysteroscope was removed and a gentle four quadrant curettage done to a gritty texture. The hysteroscope was reintroduced with the same findings as prior to curettage. Excellent hemostasis was noted, and all instruments were removed, with excellent hemostasis noted throughout.  She was then taken out of dorsal lithotomy. The patient tolerated the procedure well.  Sponge, lap and instrument counts were correct x2.  The patient was taken to recovery room in excellent condition.  Durene Romans MD Attending Center for [employer] [facility] [employer])

## 2016-07-01 NOTE — Transfer of Care (Signed)
Immediate Anesthesia Transfer of Care Note  Patient: Heidi Willis  Procedure(s) Performed: Procedure(s): HYSTEROSCOPY WITH MYOSURE (N/A) POLYPECTOMY (N/A)  Patient Location: PACU  Anesthesia Type:General  Level of Consciousness: sedated  Airway & Oxygen Therapy: Patient Spontanous Breathing and Patient connected to nasal cannula oxygen  Post-op Assessment: Report given to RN and Post -op Vital signs reviewed and stable  Post vital signs: stable  Last Vitals:  Filed Vitals:   07/01/16 1146  BP: 154/105  Pulse: 76  Temp: 37 C  Resp: 18    Last Pain: There were no vitals filed for this visit.       Complications: No apparent anesthesia complications

## 2016-07-06 ENCOUNTER — Encounter (HOSPITAL_COMMUNITY): Payer: Self-pay | Admitting: Obstetrics and Gynecology

## 2016-08-11 ENCOUNTER — Encounter: Payer: Self-pay | Admitting: Obstetrics and Gynecology

## 2016-08-11 ENCOUNTER — Ambulatory Visit (INDEPENDENT_AMBULATORY_CARE_PROVIDER_SITE_OTHER): Payer: Medicaid Other | Admitting: Obstetrics and Gynecology

## 2016-08-11 VITALS — BP 124/76 | HR 68 | Wt 325.6 lb

## 2016-08-11 DIAGNOSIS — Z09 Encounter for follow-up examination after completed treatment for conditions other than malignant neoplasm: Secondary | ICD-10-CM

## 2016-08-11 NOTE — Progress Notes (Signed)
Obstetrics and Gynecology Visit Post Op Patient  Appointment Date: 08/11/2016  OBGYN Clinic: [facility]  Primary Care Provider: [facility]  Referring Provider: Fanny Bien, MD  Chief Complaint: post op check  History of Present Illness: Heidi Willis is a 30 y.o. African-American [id number] (Patient's last menstrual period was 08/01/2016 (exact date).), seen for the above chief complaint. Her past medical history is significant for h/o HTN, BMI 92, h/o c-section and BTL  Patient is s/p 7/21 hysteroscopy and myosure polypectomy for irregular heavy bleeding and filling defect seen on u/s; she was discharged to home from the PACU.  She states she had a period recently and it was improved in terms of pain and bleeding and last for about a week and half. No VB, fevers, chills, pain; she is interested in a Mirena to help with her bleeding now that the polyp is out.   Review of Systems: 12 point review of systems is negative or as noted in the History of Present Illness.  Past Medical History:  Past Medical History:  Diagnosis Date  . BMI 50.0-59.9, adult ([place])   . Bronchitis    Hx  . Headache(784.0)    otc meds prn  . Hypertension    with 2015 pregnancy    Past Surgical History:  Past Surgical History:  Procedure Laterality Date  . CESAREAN SECTION    . CESAREAN SECTION WITH BILATERAL TUBAL LIGATION Bilateral 01/15/2014   Procedure: CESAREAN SECTION WITH BILATERAL TUBAL LIGATION;  Surgeon: Marylynn Pearson, MD;  Location: [facility];  Service: Obstetrics;  Laterality: Bilateral;  PRIMARY EDC 2/15  . DILATATION & CURETTAGE/HYSTEROSCOPY WITH MYOSURE N/A 07/01/2016   Procedure: HYSTEROSCOPY WITH MYOSURE;  Surgeon: Aletha Halim, MD;  Location: [facility];  Service: Gynecology;  Laterality: N/A;  . ELBOW SURGERY     right  . KNEE ARTHROSCOPY Left    twice  . POLYPECTOMY N/A 07/01/2016   Procedure: POLYPECTOMY;  Surgeon: Aletha Halim, MD;  Location: [facility];  Service: Gynecology;  Laterality: N/A;  . TONSILLECTOMY      Past OBGYN, Social, Family History: per prior notes  Social History:  Social History   Social History  . Marital status: Single    Spouse name: N/A  . Number of children: N/A  . Years of education: N/A   Occupational History  . Not on file.   Social History Main Topics  . Smoking status: Never Smoker  . Smokeless tobacco: Never Used  . Alcohol use No  . Drug use: No  . Sexual activity: Yes    Birth control/ protection: Surgical   Other Topics Concern  . Not on file   Social History Narrative  . No narrative on file    Medications Zyrtec, Hyzaar  Allergies Percocet [oxycodone-acetaminophen]  Physical Exam:  BP 124/76 (BP Location: Left Arm, Patient Position: Sitting, Cuff Size: Large)   Pulse 68   Wt (!) 325 lb 9.6 oz (147.7 kg)   LMP 08/01/2016 (Exact Date)   Breastfeeding? No   BMI 52.55 kg/m  Body mass index is 52.55 kg/m. General appearance: Well nourished, well developed female in no acute distress.   Laboratory: pathology reviewed (benign polyp)  Radiology: no new imaging  Assessment:  pt doing well  Plan:  I d/w her that I wouldn't recommend an IUD at this point b/c it seems that removal of the polyp is helping her periods and has eliminated the irregular bleeding and it seems that she may want a  mirena as extra bleeding insurance. I told her that I would give it a few more cycles to see if it improves. If she would still like an IUD after 2-3 more cycles then I told her to keep the f/u appointment but if it continues to improve and she doesn't want one then she can cancel the appointment.   RTC 2-69m for possible LNG IUD placement per patient preference.   Durene Romans MD Attending Center for [employer] farm [employer])

## 2016-09-28 HISTORY — PX: WISDOM TOOTH EXTRACTION: SHX21

## 2016-10-10 ENCOUNTER — Ambulatory Visit (INDEPENDENT_AMBULATORY_CARE_PROVIDER_SITE_OTHER): Payer: Medicaid Other | Admitting: Obstetrics and Gynecology

## 2016-10-10 ENCOUNTER — Encounter: Payer: Self-pay | Admitting: Obstetrics and Gynecology

## 2016-10-10 VITALS — BP 149/75 | HR 56 | Ht 66.0 in | Wt 330.0 lb

## 2016-10-10 DIAGNOSIS — N938 Other specified abnormal uterine and vaginal bleeding: Secondary | ICD-10-CM

## 2016-10-10 DIAGNOSIS — Z8742 Personal history of other diseases of the female genital tract: Secondary | ICD-10-CM

## 2016-10-10 NOTE — Progress Notes (Signed)
Obstetrics and Gynecology Visit Follow Up Patient Evaluation  Appointment Date: 10/10/2016  OBGYN Clinic: [facility] [place]  Primary Care Provider: [facility]  Chief Complaint:  Chief Complaint  Patient presents with  . Follow-up    Interval History: after patient's last visit on 8/31 she states she had a period for about 3-5d and not painful or heavy and no irregular bleeding or pain. LMP early October.   History of Present Illness: Heidi Willis is a 30 y.o. African-American [id number].  Her past medical history is significant for h/o 06/2016 h/s polypectomy for AUB, HTN, BMI 51, h/o c-section and BTL  Patient is s/p 7/21 hysteroscopy and myosure polypectomy for irregular heavy bleeding and filling defect seen on u/s; she was discharged to home from the PACU.  She states she had a period recently and it was improved in terms of pain and bleeding and last for about a week and half. No VB, fevers, chills, pain; she is interested in a Mirena to help with her bleeding now that the polyp is out.    No breast s/s, fevers, chills, chest pain, SOB, nausea, vomiting, abdominal pain, dysuria, hematuria, vaginal itching, dyspareunia, SUI or OAB, diarrhea, constipation, blood in BMs  Review of Systems:  Her 12 point review of systems is negative or as noted in the History of Present Illness.  Past Medical History:  Past Medical History:  Diagnosis Date  . BMI 50.0-59.9, adult ([place])   . Bronchitis    Hx  . Headache(784.0)    otc meds prn  . Hypertension    with 2015 pregnancy    Past Surgical History:  Past Surgical History:  Procedure Laterality Date  . CESAREAN SECTION    . CESAREAN SECTION WITH BILATERAL TUBAL LIGATION Bilateral 01/15/2014   Procedure: CESAREAN SECTION WITH BILATERAL TUBAL LIGATION;  Surgeon: Marylynn Pearson, MD;  Location: [facility];  Service: Obstetrics;  Laterality: Bilateral;  PRIMARY EDC 2/15  . DILATATION & CURETTAGE/HYSTEROSCOPY WITH MYOSURE N/A  07/01/2016   Procedure: HYSTEROSCOPY WITH MYOSURE;  Surgeon: Aletha Halim, MD;  Location: [facility];  Service: Gynecology;  Laterality: N/A;  . ELBOW SURGERY     right  . KNEE ARTHROSCOPY Left    twice  . POLYPECTOMY N/A 07/01/2016   Procedure: POLYPECTOMY;  Surgeon: Aletha Halim, MD;  Location: [facility];  Service: Gynecology;  Laterality: N/A;  . TONSILLECTOMY      Past Obstetrical History:  OB History  Gravida Para Term Preterm AB Living  2 2 2     2   SAB TAB Ectopic Multiple Live Births          1    # Outcome Date GA Lbr Len/2nd Weight Sex Delivery Anes PTL Lv  2 Term 2010 [redacted]w[redacted]d  5 lb 14 oz (2.665 kg) M Vag-Spont   LIV     Birth Comments: induction  1 Term             Obstetric Comments  SVD x1. c-section x 1 for breech (with BTL)    Past Gynecological History: no change in prior notes  Social History:  Social History   Social History  . Marital status: Single    Spouse name: N/A  . Number of children: N/A  . Years of education: N/A   Occupational History  . Not on file.   Social History Main Topics  . Smoking status: Never Smoker  . Smokeless tobacco: Never Used  . Alcohol use No  . Drug use: No  .  Sexual activity: Yes    Birth control/ protection: Surgical   Other Topics Concern  . Not on file   Social History Narrative  . No narrative on file    Family History: see prior notes  Medications Heidi Willis had no medications administered during this visit. Current Outpatient Prescriptions  Medication Sig Dispense Refill  . acetaminophen (TYLENOL) 500 MG tablet Take 1 tablet (500 mg total) by mouth every 6 (six) hours as needed. (Patient taking differently: Take 1,000 mg by mouth every 6 (six) hours as needed for mild pain. ) 30 tablet 0  . cetirizine (ZYRTEC) 10 MG tablet Take 10 mg by mouth daily.    . Cholecalciferol (VITAMIN D3) 5000 units CAPS Take 1 capsule by mouth daily.    . cloNIDine (CATAPRES) 0.2 MG tablet Take 0.2 mg by mouth daily.    .  diphenhydramine-acetaminophen (TYLENOL PM) 25-500 MG TABS tablet Take 2 tablets by mouth at bedtime as needed (For sleep.).    Marland Kitchen ferrous sulfate 325 (65 FE) MG tablet Take 325 mg by mouth daily with breakfast.    . ibuprofen (MOTRIN IB) 200 MG tablet Take 3 tablets (600 mg total) by mouth every 6 (six) hours as needed. 30 tablet 0  . metoprolol (LOPRESSOR) 100 MG tablet Take 100 mg by mouth 2 (two) times daily.    Marland Kitchen triamterene-hydrochlorothiazide (DYAZIDE) 37.5-25 MG capsule Take 1 capsule by mouth daily.    Marland Kitchen docusate sodium (COLACE) 100 MG capsule Take 1 capsule (100 mg total) by mouth 2 (two) times daily. (Patient not taking: Reported on 10/10/2016) 40 capsule 0  . losartan-hydrochlorothiazide (HYZAAR) 100-25 MG tablet Take 1 tablet by mouth daily.  2  . megestrol (MEGACE) 20 MG tablet Take 2 tablets (40 mg total) by mouth 3 (three) times daily. (Patient not taking: Reported on 10/10/2016) 120 tablet 2   No current facility-administered medications for this visit.     Allergies Percocet [oxycodone-acetaminophen]   Physical Exam:  BP (!) 149/75   Pulse (!) 56   Ht 5\' 6"  (1.676 m)   Wt (!) 330 lb (149.7 kg)   BMI 53.26 kg/m  Body mass index is 53.26 kg/m. General appearance: Well nourished, well developed female in no acute distress.   Laboratory: none  Radiology: none  Assessment: pt doing well  Plan: options again reviewed with patient and since her periods are normal, I told her that she has the option of expectant management but risk of recurrence of large polyp again. She states she got started on pills about a year post partum from her last pregnancy but no u/s was done at that time so she could've had the polyp then, before the use of hormone management. I recommended that she do hormone management with LNG IUD preferred and she is strongly leaning towards this. Will tenatively set her up to come back in a week or two for LNG placement with patient to review some more  information in the mean time.   Durene Romans MD Attending Center for [employer])

## 2016-10-31 ENCOUNTER — Ambulatory Visit (INDEPENDENT_AMBULATORY_CARE_PROVIDER_SITE_OTHER): Payer: Medicaid Other | Admitting: Obstetrics and Gynecology

## 2016-10-31 ENCOUNTER — Encounter: Payer: Self-pay | Admitting: Obstetrics and Gynecology

## 2016-10-31 VITALS — BP 136/80 | HR 53 | Ht 66.0 in | Wt 329.1 lb

## 2016-10-31 DIAGNOSIS — Z3202 Encounter for pregnancy test, result negative: Secondary | ICD-10-CM

## 2016-10-31 DIAGNOSIS — Z30430 Encounter for insertion of intrauterine contraceptive device: Secondary | ICD-10-CM

## 2016-10-31 LAB — POCT PREGNANCY, URINE: PREG TEST UR: NEGATIVE

## 2016-10-31 MED ORDER — LEVONORGESTREL 18.6 MCG/DAY IU IUD
INTRAUTERINE_SYSTEM | Freq: Once | INTRAUTERINE | Status: AC
  Administered 2016-10-31: 15:00:00 via INTRAUTERINE

## 2016-10-31 NOTE — Procedures (Addendum)
Intrauterine Device (IUD) Insertion Procedure Note  LMP 10/25/2016 (no current bleeding, period was light and not painful).  After a recent pap (results: negative/date: 10-31-2016) and recent negative gonorrhea-chlamydia testing done today and pap smear in 2017 negative. Her written consent was obtained. The patient understands the risks of IUD placement, which include but are not limited to: bleeding, infection, uterine perforation, risk of expulsion, risk of failure < 1%, increased risk of ectopic pregnancy in the event of failure.   Prior to the procedure being performed, the patient (or guardian) was asked to state their full name, date of birth, and the type of procedure being performed. A bimanual exam showed the uterus to be midposition.  Next, the cervix and vagina were cleaned with an antiseptic solution, and the cervix was grasped with a tenaculum.  The uterus was sounded to 9 cm.  The [place] IUD was placed without difficulty in the usual, sterile fashion.  The strings were cut to 3-4 cm.  The tenaculum was removed and cervix was found to be hemostatic.    No complications, patient tolerated the procedure well.  Durene Romans MD Attending Center for [employer] farm [employer])

## 2016-10-31 NOTE — Addendum Note (Signed)
Addended by: Samuel Germany on: 10/31/2016 02:47 PM   Modules accepted: Orders

## 2016-11-28 ENCOUNTER — Encounter: Payer: Self-pay | Admitting: Obstetrics and Gynecology

## 2016-11-28 ENCOUNTER — Ambulatory Visit (INDEPENDENT_AMBULATORY_CARE_PROVIDER_SITE_OTHER): Payer: Medicaid Other | Admitting: Obstetrics and Gynecology

## 2016-11-28 VITALS — BP 134/80 | HR 59 | Wt 323.4 lb

## 2016-11-28 DIAGNOSIS — Z30431 Encounter for routine checking of intrauterine contraceptive device: Secondary | ICD-10-CM

## 2016-11-28 NOTE — Progress Notes (Signed)
Obstetrics and Gynecology Visit Return Patient Evaluation  Appointment Date: 11/28/2016  Primary Care Provider: [facility]  Referring Provider: Fanny Bien, MD  Chief Complaint: IUD string check  History of Present Illness:  Heidi Willis is a 30 y.o. that had a [place]ile[place] IUD placed approximately on 11/20 for h/o AUB and polyps  Interval History: Since that time, she states that she's had no problems with it. Her period was a little longer than usual but lighter and not as painful. Has had intercourse since then and no issues.   Review of Systems: as noted in the History of Present Illness.  Medications: reviewed Allergies: is allergic to percocet [oxycodone-acetaminophen].  Physical Exam:  BP 134/80   Pulse (!) 59   Wt (!) 323 lb 6.4 oz (146.7 kg)   LMP 11/17/2016   BMI 52.20 kg/m  Body mass index is 52.2 kg/m. General appearance: Well nourished, well developed female in no acute distress.  Abdomen: diffusely non tender to palpation, non distended, and no masses, hernias Neuro/Psych:  Normal mood and affect.    Pelvic exam:  Two IUD strings present seen coming from the cervical os, approx 3cm and tucked into the fornices EGBUS, vaginal vault and cervix: within normal limits   Assessment: IUD strings present in proper location; pt doing well  Plan: we will see her back in 1 year/PRN. Pt told to let us know any issues or problems in the interim  Durene Romans MD Attending Center for [employer] [facility])

## 2017-03-28 IMAGING — CR DG CHEST 2V
2 series · 2 of 2 positions shown · non-contrast
Comparison: PA and lateral chest x-ray November 28, 2011

CLINICAL DATA: Preoperative examination prior to uterine polyp
removal, history of hypertension, bronchitis, nonsmoker.

EXAM:
CHEST  2 VIEW

[chest pa]
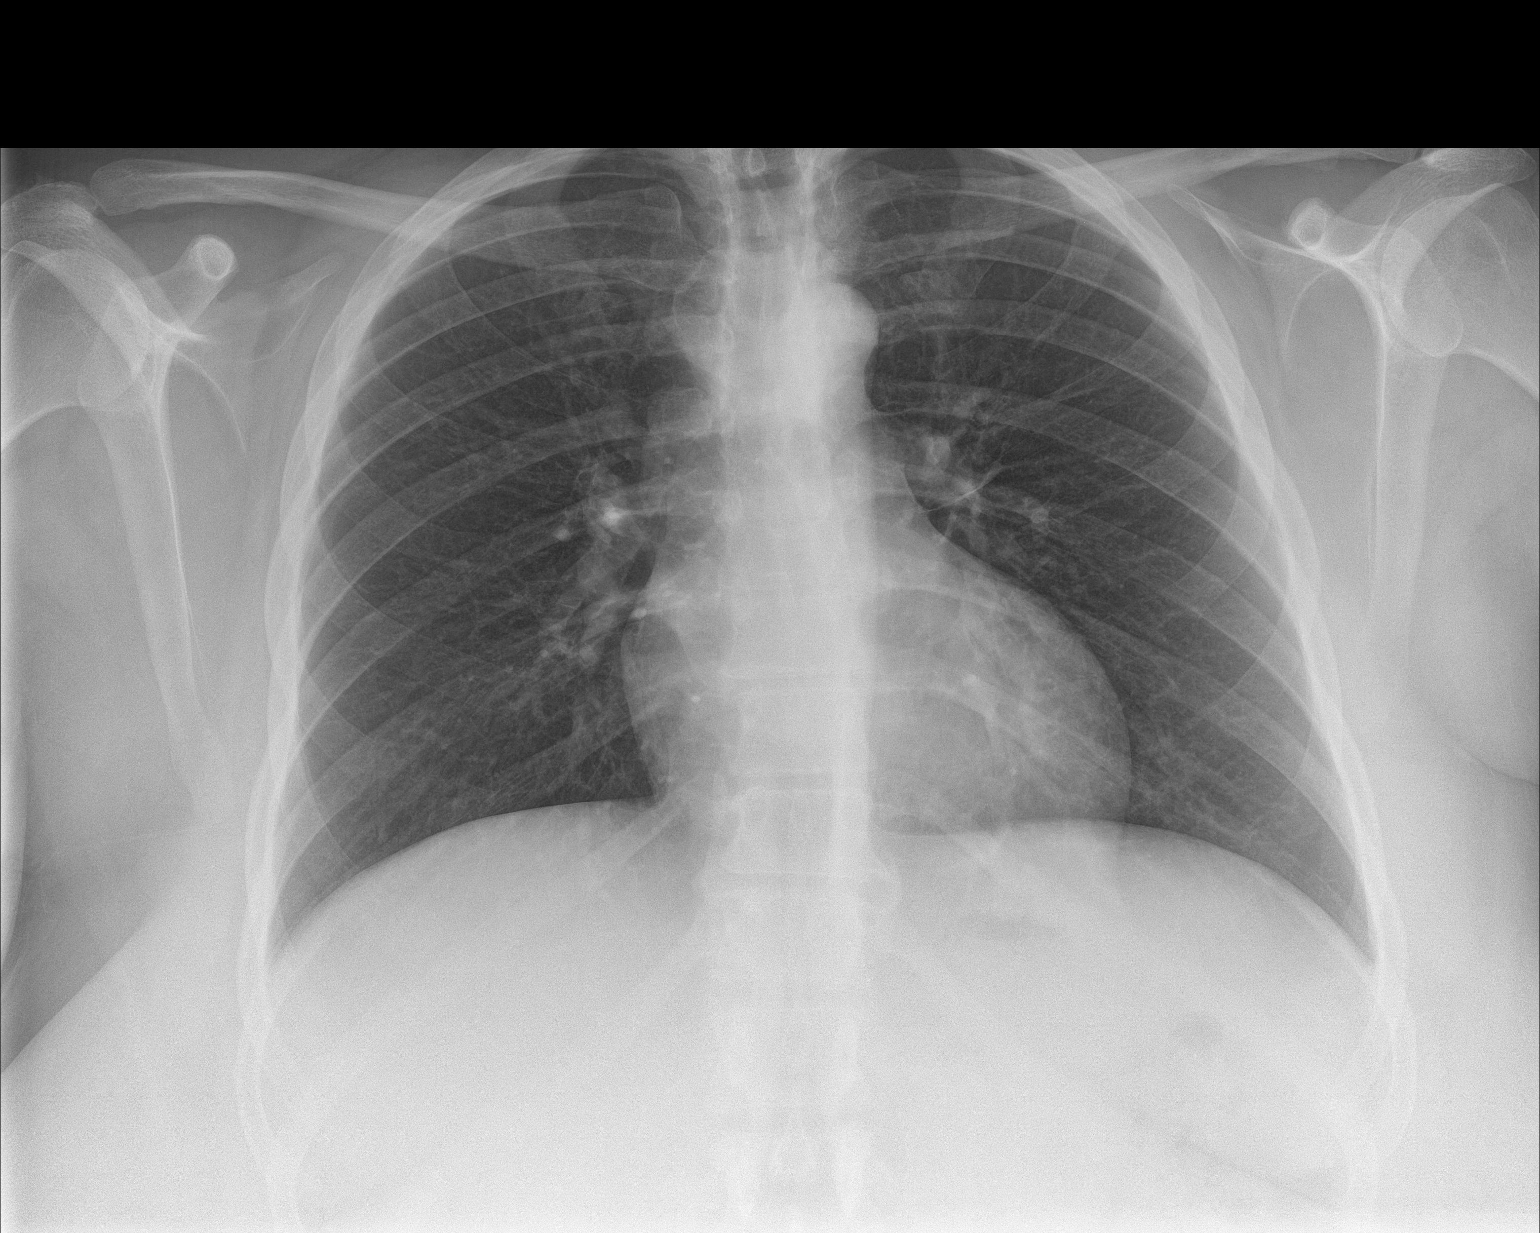

[chest lat]
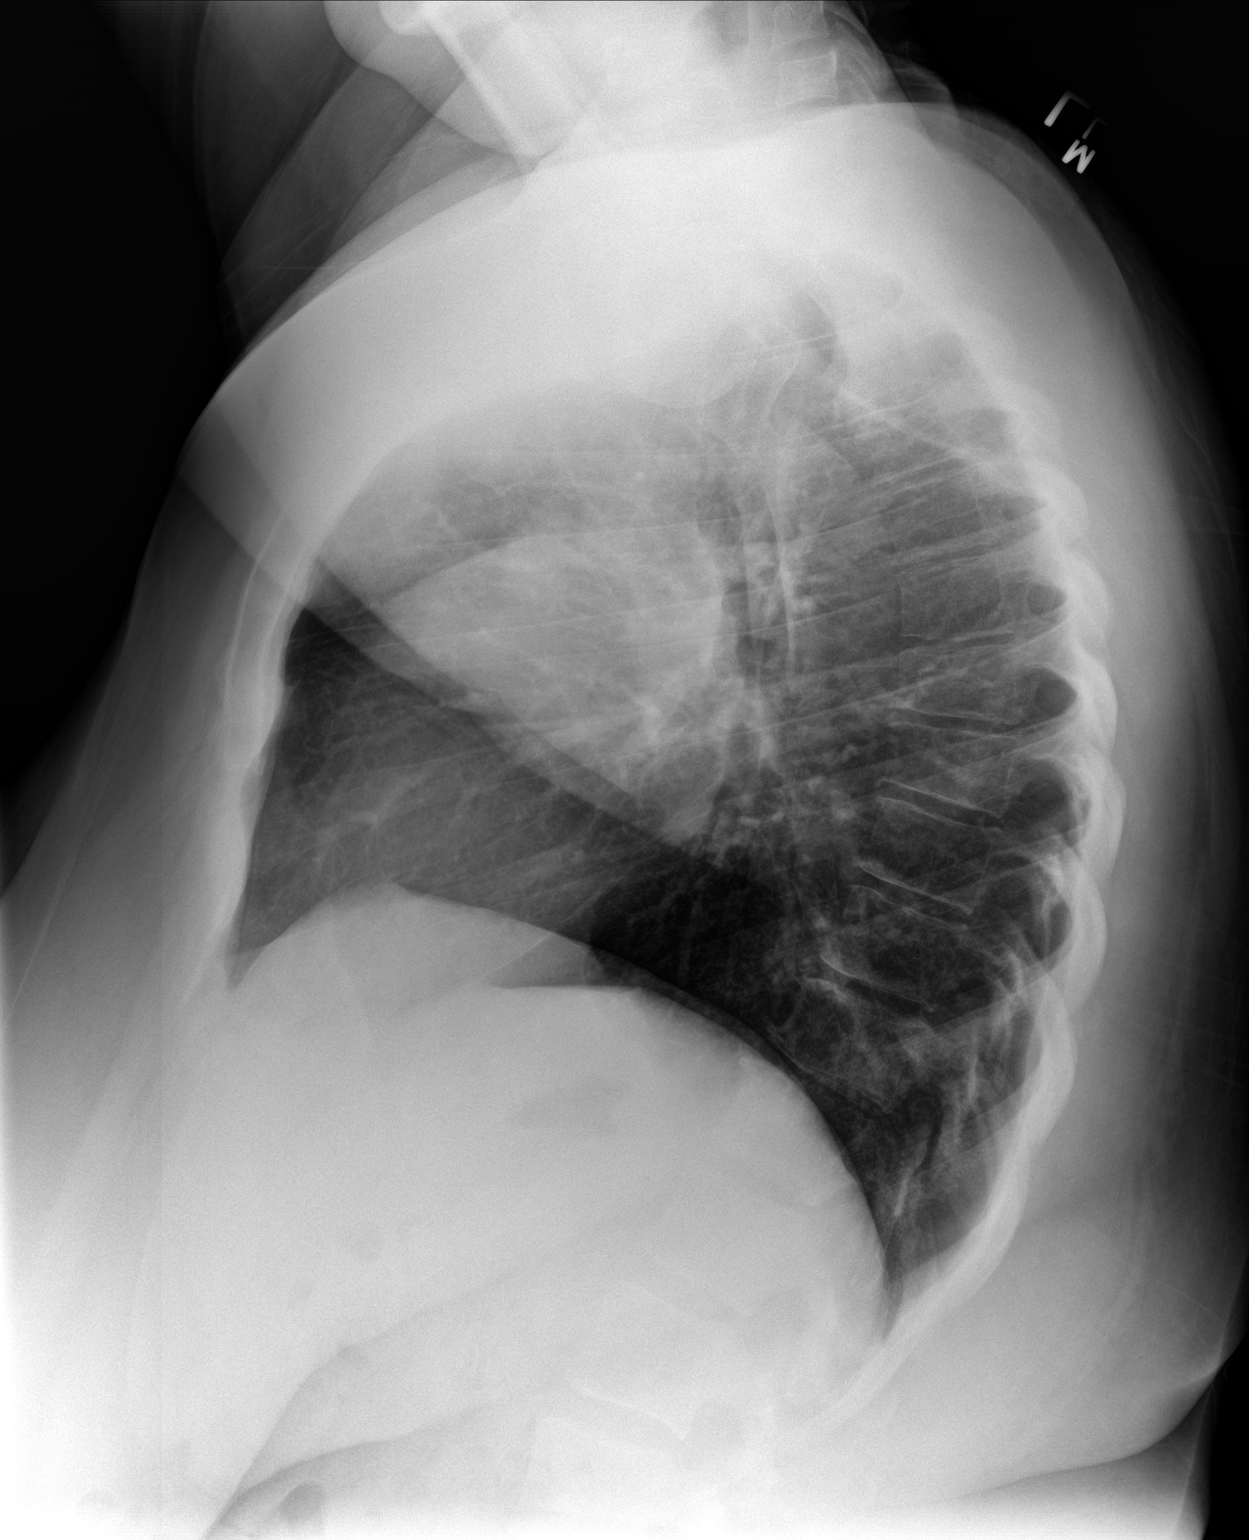

[2 of 2 positions shown; findings below may reference images not displayed]

FINDINGS: The lungs are adequately inflated and clear. The heart and pulmonary
vascularity are normal. The mediastinum is normal in width. There is
no pleural effusion. The bony thorax exhibits no acute abnormality.
IMPRESSION: There is no active cardiopulmonary disease.

## 2017-08-17 ENCOUNTER — Telehealth: Payer: Self-pay | Admitting: Obstetrics and Gynecology

## 2017-08-17 NOTE — Telephone Encounter (Signed)
Patient called to say she could not get off work to come to her appointment. She stated she would call back later after she tries to work something out with her job.

## 2017-08-24 ENCOUNTER — Ambulatory Visit: Payer: Self-pay | Admitting: Obstetrics and Gynecology

## 2018-04-12 ENCOUNTER — Encounter (HOSPITAL_COMMUNITY): Payer: Self-pay | Admitting: Emergency Medicine

## 2018-04-12 ENCOUNTER — Other Ambulatory Visit: Payer: Self-pay

## 2018-04-12 ENCOUNTER — Ambulatory Visit (HOSPITAL_COMMUNITY)
Admission: EM | Admit: 2018-04-12 | Discharge: 2018-04-12 | Disposition: A | Discharge status: Home / Self Care | Payer: Self-pay | Attending: Family Medicine | Admitting: Family Medicine

## 2018-04-12 DIAGNOSIS — B309 Viral conjunctivitis, unspecified: Secondary | ICD-10-CM

## 2018-04-12 DIAGNOSIS — J019 Acute sinusitis, unspecified: Secondary | ICD-10-CM

## 2018-04-12 MED ORDER — BENZONATATE 200 MG PO CAPS: 200.0000 mg | ORAL_CAPSULE | Freq: Three times a day (TID) | ORAL | 0 refills | Status: DC

## 2018-04-12 MED ORDER — OLOPATADINE HCL 0.1 % OP SOLN: 1.0000 [drp] | Freq: Two times a day (BID) | OPHTHALMIC | 0 refills | Status: DC

## 2018-04-12 MED ORDER — POLYMYXIN B-TRIMETHOPRIM 10000-0.1 UNIT/ML-% OP SOLN: 1.0000 [drp] | OPHTHALMIC | 0 refills | Status: AC

## 2018-04-12 MED ORDER — AMOXICILLIN-POT CLAVULANATE 875-125 MG PO TABS: 1.0000 | ORAL_TABLET | Freq: Two times a day (BID) | ORAL | 0 refills | Status: AC

## 2018-04-12 NOTE — ED Triage Notes (Signed)
Pt reports URI symptoms over the last few weeks.  In the last week she has had burning, itching, and drainage bilaterally in her eyes.

## 2018-04-12 NOTE — ED Provider Notes (Signed)
[place]    CSN: [id number] Arrival date & time: 04/12/18  8338     History   Chief Complaint Chief Complaint  Patient presents with  . Conjunctivitis    bilateral    HPI Heidi Willis is a 32 y.o. female presenting today for evaluation of bilateral eye redness.  Patient states that for the past week she has had eye redness, itching, irritation and a burning sensation.  Waking up with crusting in her eyes.  Watery drainage during the day.  Did note some blurring of vision this morning, but denies any persistent changes in vision.  Denies eye pain.  Patient does not wear contacts.  Patient also notes for the past 3 weeks she has had congestion and cough.  She has been taking Mucinex, daily allergy pill and nasal sprays without relief.  Minimal sore throat.  Denies fevers, nausea, vomiting, abdominal pain.  HPI  Past Medical History:  Diagnosis Date  . BMI 50.0-59.9, adult ([place])   . Bronchitis    Hx  . Headache(784.0)    otc meds prn  . Hypertension    with 2015 pregnancy    Patient Active Problem List   Diagnosis Date Noted  . Abnormal uterine bleeding (AUB) 06/10/2016    Past Surgical History:  Procedure Laterality Date  . CESAREAN SECTION    . CESAREAN SECTION WITH BILATERAL TUBAL LIGATION Bilateral 01/15/2014   Procedure: CESAREAN SECTION WITH BILATERAL TUBAL LIGATION;  Surgeon: Marylynn Pearson, MD;  Location: [facility];  Service: Obstetrics;  Laterality: Bilateral;  PRIMARY EDC 2/15  . DILATATION & CURETTAGE/HYSTEROSCOPY WITH MYOSURE N/A 07/01/2016   Procedure: HYSTEROSCOPY WITH MYOSURE;  Surgeon: Aletha Halim, MD;  Location: [facility];  Service: Gynecology;  Laterality: N/A;  . ELBOW SURGERY     right  . KNEE ARTHROSCOPY Left    twice  . POLYPECTOMY N/A 07/01/2016   Procedure: POLYPECTOMY;  Surgeon: Aletha Halim, MD;  Location: [facility];  Service: Gynecology;  Laterality: N/A;  . TONSILLECTOMY    . WISDOM TOOTH EXTRACTION  09/28/2016    OB History     Gravida  2   Para  2   Term  2   Preterm      AB      Living  2     SAB      TAB      Ectopic      Multiple      Live Births  1        Obstetric Comments  SVD x1. c-section x 1 for breech (with BTL)         Home Medications    Prior to Admission medications   Medication Sig Start Date End Date Taking? Authorizing Provider  metoprolol (LOPRESSOR) 100 MG tablet Take 100 mg by mouth 2 (two) times daily.   Yes [provider]  triamterene-hydrochlorothiazide (DYAZIDE) 37.5-25 MG capsule Take 1 capsule by mouth daily.   Yes [provider]  acetaminophen (TYLENOL) 500 MG tablet Take 1 tablet (500 mg total) by mouth every 6 (six) hours as needed. Patient taking differently: Take 1,000 mg by mouth every 6 (six) hours as needed for mild pain.  01/23/16   Gloriann Loan, PA-C  amoxicillin-clavulanate (AUGMENTIN) 875-125 MG tablet Take 1 tablet by mouth every 12 (twelve) hours for 10 days. 04/12/18 04/22/18  Shaquavia Whisonant C, PA-C  benzonatate (TESSALON) 200 MG capsule Take 1 capsule (200 mg total) by mouth every 8 (eight) hours. 04/12/18  Charlea Nardo C, PA-C  cetirizine (ZYRTEC) 10 MG tablet Take 10 mg by mouth daily.    [provider]  Cholecalciferol (VITAMIN D3) 5000 units CAPS Take 1 capsule by mouth daily.    [provider]  cloNIDine (CATAPRES) 0.2 MG tablet Take 0.2 mg by mouth daily.    [provider]  diphenhydramine-acetaminophen (TYLENOL PM) 25-500 MG TABS tablet Take 2 tablets by mouth at bedtime as needed (For sleep.).    [provider]  ferrous sulfate 325 (65 FE) MG tablet Take 325 mg by mouth daily with breakfast.    [provider]  ibuprofen (MOTRIN IB) 200 MG tablet Take 3 tablets (600 mg total) by mouth every 6 (six) hours as needed. 07/01/16   Aletha Halim, MD  megestrol (MEGACE) 20 MG tablet Take 2 tablets (40 mg total) by mouth 3 (three) times daily. Patient not taking: Reported on  10/31/2016 05/04/16   Luvenia Redden, PA-C  olopatadine (PATANOL) 0.1 % ophthalmic solution Place 1 drop into both eyes 2 (two) times daily. 04/12/18   Hayes Rehfeldt C, PA-C  trimethoprim-polymyxin b (POLYTRIM) ophthalmic solution Place 1 drop into both eyes every 4 (four) hours for 7 days. 04/12/18 04/19/18  Raymonda Pell, Elesa Hacker, PA-C    Family History History reviewed. No pertinent family history.  Social History Social History   Tobacco Use  . Smoking status: Never Smoker  . Smokeless tobacco: Never Used  Substance Use Topics  . Alcohol use: No  . Drug use: No     Allergies   Percocet [oxycodone-acetaminophen]   Review of Systems Review of Systems  Constitutional: Negative for chills, fatigue and fever.  HENT: Positive for congestion, rhinorrhea and sinus pressure. Negative for ear pain, sore throat and trouble swallowing.   Eyes: Positive for discharge, redness and itching. Negative for photophobia, pain and visual disturbance.  Respiratory: Positive for cough. Negative for chest tightness and shortness of breath.   Cardiovascular: Negative for chest pain.  Gastrointestinal: Negative for abdominal pain, nausea and vomiting.  Musculoskeletal: Negative for myalgias.  Skin: Negative for rash.  Neurological: Negative for dizziness, light-headedness and headaches.     Physical Exam Triage Vital Signs ED Triage Vitals  Enc Vitals Group     BP 04/12/18 1017 (!) 149/98     Pulse Rate 04/12/18 1017 (!) 54     Resp --      Temp 04/12/18 1017 98 F (36.7 C)     Temp Source 04/12/18 1017 Oral     SpO2 04/12/18 1017 100 %     Weight --      Height --      Head Circumference --      Peak Flow --      Pain Score 04/12/18 1018 4     Pain Loc --      Pain Edu? --      Excl. in [place]? --    No data found.  Updated Vital Signs BP (!) 149/98 (BP Location: Left Wrist)   Pulse (!) 54   Temp 98 F (36.7 C) (Oral)   SpO2 100%   Visual Acuity Right Eye Distance: 20/40 Left Eye  Distance: 20/50 Bilateral Distance: 20/20  Right Eye Near:   Left Eye Near:    Bilateral Near:     Physical Exam  Constitutional: She appears well-developed and well-nourished. No distress.  HENT:  Head: Normocephalic and atraumatic.  Bilateral TMs nonerythematous, nasal mucosa erythematous, rhinorrhea present bilaterally, posterior oropharynx nonerythematous,  no tonsillar enlargement or exudate.  Eyes: Pupils are equal, round, and reactive to light. Conjunctivae and EOM are normal.  As bilaterally with mild conjunctival erythema, no foreign body seen on eversion, no discharge seen in medial canthus.  Neck: Neck supple.  Cardiovascular: Normal rate and regular rhythm.  No murmur heard. Pulmonary/Chest: Effort normal and breath sounds normal. No respiratory distress.  Breathing comfortably at rest, CTA BL, occasional bronchospasm in room  Musculoskeletal: She exhibits no edema.  Neurological: She is alert.  Skin: Skin is warm and dry.  Psychiatric: She has a normal mood and affect.  Nursing note and vitals reviewed.    UC Treatments / Results  Labs (all labs ordered are listed, but only abnormal results are displayed) Labs Reviewed - No data to display  EKG None  Radiology No results found.  Procedures Procedures (including critical care time)  Medications Ordered in UC Medications - No data to display  Initial Impression / Assessment and Plan / UC Course  I have reviewed the triage vital signs and the nursing notes.  Pertinent labs & imaging results that were available during my care of the patient were reviewed by me and considered in my medical decision making (see chart for details).     Patient likely with viral conjunctivitis.  Will provide olopatadine to treat the itching of the eyes, also provide Polytrim in case this is bacterial as well since it has not been improving over the past week.  Visual acuity with no acute abnormalities in either eye.  Discussed  returning if symptoms not improving, developing swelling around eye, pain, changes in vision.  Patient has also had URI symptoms fo 3 weeks without improvement with over-the-counter measures, will begin on Augmentin to treat for sinusitis.  Tessalon for cough.  Discussed other over-the-counter measures including Flonase and Nasacort to try to help with congestion.  Discussed strict return precautions. Patient verbalized understanding and is agreeable with plan.   Final Clinical Impressions(s) / UC Diagnoses   Final diagnoses:  Acute viral conjunctivitis of both eyes  Acute non-recurrent sinusitis, unspecified location     Discharge Instructions     Begin Augmentin for sinus infection Tessalon for cough Olopatadine eye drops for itching Polytrim to treat conjunctivitis   ED Prescriptions    Medication Sig Dispense Auth. Provider   olopatadine (PATANOL) 0.1 % ophthalmic solution Place 1 drop into both eyes 2 (two) times daily. 5 mL Charissa Knowles C, PA-C   trimethoprim-polymyxin b (POLYTRIM) ophthalmic solution Place 1 drop into both eyes every 4 (four) hours for 7 days. 10 mL Jori Frerichs C, PA-C   amoxicillin-clavulanate (AUGMENTIN) 875-125 MG tablet Take 1 tablet by mouth every 12 (twelve) hours for 10 days. 20 tablet Toshika Parrow C, PA-C   benzonatate (TESSALON) 200 MG capsule Take 1 capsule (200 mg total) by mouth every 8 (eight) hours. 21 capsule Jaziah Goeller C, PA-C     Controlled Substance Prescriptions [place] Controlled Substance Registry consulted? Not Applicable   Janith Lima, [place] 04/12/18 1055

## 2018-04-12 NOTE — Discharge Instructions (Signed)
Begin Augmentin for sinus infection Tessalon for cough Olopatadine eye drops for itching Polytrim to treat conjunctivitis

## 2019-05-17 ENCOUNTER — Other Ambulatory Visit: Payer: Self-pay

## 2019-05-17 ENCOUNTER — Ambulatory Visit (HOSPITAL_COMMUNITY)
Admission: EM | Admit: 2019-05-17 | Discharge: 2019-05-17 | Disposition: A | Discharge status: Home / Self Care | Payer: Self-pay | Attending: Family Medicine | Admitting: Family Medicine

## 2019-05-17 ENCOUNTER — Encounter (HOSPITAL_COMMUNITY): Payer: Self-pay

## 2019-05-17 DIAGNOSIS — H00014 Hordeolum externum left upper eyelid: Secondary | ICD-10-CM

## 2019-05-17 MED ORDER — POLYMYXIN B-TRIMETHOPRIM 10000-0.1 UNIT/ML-% OP SOLN: 1.0000 [drp] | OPHTHALMIC | 0 refills | Status: DC

## 2019-05-17 NOTE — ED Provider Notes (Signed)
[place]    CSN: [id number] Arrival date & time: 05/17/19  1034     History   Chief Complaint Chief Complaint  Patient presents with  . Stye    HPI Heidi Willis is a 33 y.o. female presenting for acute concern of left upper line lid pain and swelling.  Patient states this started this morning.  Patient states that her eye felt a little painful last night, woke up with swollen eye.  Patient used hot compresses which help alleviate some symptoms.  Patient has never had this before.  Patient denies wearing a cup, false lashes, contact lenses.  Patient denies pain with eye movement, change in vision, photophobia, fever, purulent discharge.    Past Medical History:  Diagnosis Date  . BMI 50.0-59.9, adult ([place])   . Bronchitis    Hx  . Headache(784.0)    otc meds prn  . Hypertension    with 2015 pregnancy    Patient Active Problem List   Diagnosis Date Noted  . Abnormal uterine bleeding (AUB) 06/10/2016    Past Surgical History:  Procedure Laterality Date  . CESAREAN SECTION    . CESAREAN SECTION WITH BILATERAL TUBAL LIGATION Bilateral 01/15/2014   Procedure: CESAREAN SECTION WITH BILATERAL TUBAL LIGATION;  Surgeon: Marylynn Pearson, MD;  Location: [facility];  Service: Obstetrics;  Laterality: Bilateral;  PRIMARY EDC 2/15  . DILATATION & CURETTAGE/HYSTEROSCOPY WITH MYOSURE N/A 07/01/2016   Procedure: HYSTEROSCOPY WITH MYOSURE;  Surgeon: Aletha Halim, MD;  Location: [facility];  Service: Gynecology;  Laterality: N/A;  . ELBOW SURGERY     right  . KNEE ARTHROSCOPY Left    twice  . POLYPECTOMY N/A 07/01/2016   Procedure: POLYPECTOMY;  Surgeon: Aletha Halim, MD;  Location: [facility];  Service: Gynecology;  Laterality: N/A;  . TONSILLECTOMY    . WISDOM TOOTH EXTRACTION  09/28/2016    OB History    Gravida  2   Para  2   Term  2   Preterm      AB      Living  2     SAB      TAB      Ectopic      Multiple      Live Births  1        Obstetric  Comments  SVD x1. c-section x 1 for breech (with BTL)         Home Medications    Prior to Admission medications   Medication Sig Start Date End Date Taking? Authorizing Provider  acetaminophen (TYLENOL) 500 MG tablet Take 1 tablet (500 mg total) by mouth every 6 (six) hours as needed. Patient taking differently: Take 1,000 mg by mouth every 6 (six) hours as needed for mild pain.  01/23/16   Gloriann Loan, PA-C  benzonatate (TESSALON) 200 MG capsule Take 1 capsule (200 mg total) by mouth every 8 (eight) hours. 04/12/18   Wieters, Hallie C, PA-C  cetirizine (ZYRTEC) 10 MG tablet Take 10 mg by mouth daily.    [provider]  Cholecalciferol (VITAMIN D3) 5000 units CAPS Take 1 capsule by mouth daily.    [provider]  cloNIDine (CATAPRES) 0.2 MG tablet Take 0.2 mg by mouth daily.    [provider]  diphenhydramine-acetaminophen (TYLENOL PM) 25-500 MG TABS tablet Take 2 tablets by mouth at bedtime as needed (For sleep.).    [provider]  ferrous sulfate 325 (65 FE) MG tablet Take 325 mg by mouth  daily with breakfast.    [provider]  ibuprofen (MOTRIN IB) 200 MG tablet Take 3 tablets (600 mg total) by mouth every 6 (six) hours as needed. 07/01/16   Aletha Halim, MD  metoprolol (LOPRESSOR) 100 MG tablet Take 100 mg by mouth 2 (two) times daily.    [provider]  olopatadine (PATANOL) 0.1 % ophthalmic solution Place 1 drop into both eyes 2 (two) times daily. 04/12/18   Wieters, Hallie C, PA-C  triamterene-hydrochlorothiazide (DYAZIDE) 37.5-25 MG capsule Take 1 capsule by mouth daily.    [provider]  trimethoprim-polymyxin b (POLYTRIM) ophthalmic solution Place 1 drop into the left eye every 4 (four) hours. 05/17/19   Hall-Potvin, [place], PA-C    Family History Family History  Problem Relation Age of Onset  . Heart failure Mother   . Healthy Father     Social History Social History   Tobacco Use  . Smoking  status: Never Smoker  . Smokeless tobacco: Never Used  Substance Use Topics  . Alcohol use: No  . Drug use: No     Allergies   Percocet [oxycodone-acetaminophen]   Review of Systems As per HPI   Physical Exam Triage Vital Signs ED Triage Vitals  Enc Vitals Group     BP 05/17/19 1054 (!) 145/90     Pulse Rate 05/17/19 1054 65     Resp 05/17/19 1054 18     Temp 05/17/19 1054 98.2 F (36.8 C)     Temp Source 05/17/19 1054 Oral     SpO2 05/17/19 1054 98 %     Weight --      Height --      Head Circumference --      Peak Flow --      Pain Score 05/17/19 1052 8     Pain Loc --      Pain Edu? --      Excl. in [place]? --    No data found.  Updated Vital Signs BP (!) 145/90 (BP Location: Left Arm)   Pulse 65   Temp 98.2 F (36.8 C) (Oral)   Resp 18   SpO2 98%   Visual Acuity Right Eye Distance:   Left Eye Distance:   Bilateral Distance:    Right Eye Near:   Left Eye Near:    Bilateral Near:     Physical Exam Constitutional:      General: She is not in acute distress. HENT:     Head: Normocephalic and atraumatic.  Eyes:     General: Lids are normal. Lids are everted, no foreign bodies appreciated. Vision grossly intact. Gaze aligned appropriately. No allergic shiner, visual field deficit or scleral icterus.       Right eye: No foreign body or discharge.        Left eye: Hordeolum present.No foreign body or discharge.     Extraocular Movements: Extraocular movements intact.     Conjunctiva/sclera: Conjunctivae normal.     Right eye: Right conjunctiva is not injected.     Left eye: Left conjunctiva is not injected.     Pupils: Pupils are equal, round, and reactive to light.     Comments: Left upper eyelid swelling, small white punctate noted medial aspect of upper lid when everted.  No active discharge  Cardiovascular:     Rate and Rhythm: Normal rate.  Pulmonary:     Effort: Pulmonary effort is normal.  Neurological:     Mental Status: She is alert and  oriented to person, place, and time.      UC Treatments / Results  Labs (all labs ordered are listed, but only abnormal results are displayed) Labs Reviewed - No data to display  EKG None  Radiology No results found.  Procedures Procedures (including critical care time)  Medications Ordered in UC Medications - No data to display  Initial Impression / Assessment and Plan / UC Course  I have reviewed the triage vital signs and the nursing notes.  Pertinent labs & imaging results that were available during my care of the patient were reviewed by me and considered in my medical decision making (see chart for details).     33 year old female with left upper eyelid hordeolum.  Patient to treat conservatively with hot compresses 4 times daily.  Topical antibiotic drops prescribed if symptoms do not resolve/worsen over the next 2 to 3 days.  Return precautions discussed, patient verbalized understanding. Final Clinical Impressions(s) / UC Diagnoses   Final diagnoses:  Hordeolum externum left upper eyelid     Discharge Instructions     Use hot compress x 5 minutes, 4 times daily. You may pick up antibiotic eyedrops if no improvement, or symptoms worsen, over next 2-3 days. Return to clinic if you have change in vision, fever, ear pain, increased discharge.    ED Prescriptions    Medication Sig Dispense Auth. Provider   trimethoprim-polymyxin b (POLYTRIM) ophthalmic solution Place 1 drop into the left eye every 4 (four) hours. 10 mL Hall-Potvin, [place], PA-C     Controlled Substance Prescriptions [place] Controlled Substance Registry consulted? Not Applicable   Quincy Sheehan, [place] 05/17/19 1116

## 2019-05-17 NOTE — ED Triage Notes (Signed)
Patient presents to Urgent Care with complaints of left eye pain and swelling on the eyelid since yesterday. Patient reports the pain was much worse today and it is visibly swollen.

## 2019-05-17 NOTE — Discharge Instructions (Signed)
Use hot compress x 5 minutes, 4 times daily. You may pick up antibiotic eyedrops if no improvement, or symptoms worsen, over next 2-3 days. Return to clinic if you have change in vision, fever, ear pain, increased discharge.

## 2019-07-31 ENCOUNTER — Other Ambulatory Visit: Payer: Self-pay

## 2019-07-31 ENCOUNTER — Encounter: Payer: Self-pay | Admitting: Obstetrics and Gynecology

## 2019-07-31 ENCOUNTER — Ambulatory Visit (INDEPENDENT_AMBULATORY_CARE_PROVIDER_SITE_OTHER): Payer: Medicaid Other | Admitting: Obstetrics and Gynecology

## 2019-07-31 VITALS — BP 154/92 | HR 56 | Wt 330.6 lb

## 2019-07-31 DIAGNOSIS — Z01419 Encounter for gynecological examination (general) (routine) without abnormal findings: Secondary | ICD-10-CM

## 2019-07-31 DIAGNOSIS — B373 Candidiasis of vulva and vagina: Secondary | ICD-10-CM | POA: Diagnosis not present

## 2019-07-31 DIAGNOSIS — Z0000 Encounter for general adult medical examination without abnormal findings: Secondary | ICD-10-CM | POA: Diagnosis not present

## 2019-07-31 DIAGNOSIS — B3731 Acute candidiasis of vulva and vagina: Secondary | ICD-10-CM

## 2019-07-31 MED ORDER — FLUCONAZOLE 150 MG PO TABS: 150.0000 mg | ORAL_TABLET | Freq: Once | ORAL | 0 refills | Status: AC

## 2019-07-31 NOTE — Patient Instructions (Signed)
Vaginal Yeast infection, Adult  Vaginal yeast infection is a condition that causes vaginal discharge as well as soreness, swelling, and redness (inflammation) of the vagina. This is a common condition. Some women get this infection frequently. What are the causes? This condition is caused by a change in the normal balance of the yeast (candida) and bacteria that live in the vagina. This change causes an overgrowth of yeast, which causes the inflammation. What increases the risk? The condition is more likely to develop in women who:  Take antibiotic medicines.  Have diabetes.  Take birth control pills.  Are pregnant.  Douche often.  Have a weak body defense system (immune system).  Have been taking steroid medicines for a long time.  Frequently wear tight clothing. What are the signs or symptoms? Symptoms of this condition include:  White, thick, creamy vaginal discharge.  Swelling, itching, redness, and irritation of the vagina. The lips of the vagina (vulva) may be affected as well.  Pain or a burning feeling while urinating.  Pain during sex. How is this diagnosed? This condition is diagnosed based on:  Your medical history.  A physical exam.  A pelvic exam. Your health care provider will examine a sample of your vaginal discharge under a microscope. Your health care provider may send this sample for testing to confirm the diagnosis. How is this treated? This condition is treated with medicine. Medicines may be over-the-counter or prescription. You may be told to use one or more of the following:  Medicine that is taken by mouth (orally).  Medicine that is applied as a cream (topically).  Medicine that is inserted directly into the vagina (suppository). Follow these instructions at home:  Lifestyle  Do not have sex until your health care provider approves. Tell your sex partner that you have a yeast infection. That person should go to his or her health care  provider and ask if they should also be treated.  Do not wear tight clothes, such as pantyhose or tight pants.  Wear breathable cotton underwear. General instructions  Take or apply over-the-counter and prescription medicines only as told by your health care provider.  Eat more yogurt. This may help to keep your yeast infection from returning.  Do not use tampons until your health care provider approves.  Try taking a sitz bath to help with discomfort. This is a warm water bath that is taken while you are sitting down. The water should only come up to your hips and should cover your buttocks. Do this 3-4 times per day or as told by your health care provider.  Do not douche.  If you have diabetes, keep your blood sugar levels under control.  Keep all follow-up visits as told by your health care provider. This is important. Contact a health care provider if:  You have a fever.  Your symptoms go away and then return.  Your symptoms do not get better with treatment.  Your symptoms get worse.  You have new symptoms.  You develop blisters in or around your vagina.  You have blood coming from your vagina and it is not your menstrual period.  You develop pain in your abdomen. Summary  Vaginal yeast infection is a condition that causes discharge as well as soreness, swelling, and redness (inflammation) of the vagina.  This condition is treated with medicine. Medicines may be over-the-counter or prescription.  Take or apply over-the-counter and prescription medicines only as told by your health care provider.  Do not douche.   Do not have sex or use tampons until your health care provider approves.  Contact a health care provider if your symptoms do not get better with treatment or your symptoms go away and then return. This information is not intended to replace advice given to you by your health care provider. Make sure you discuss any questions you have with your health care  provider. Document Released: 09/07/2005 Document Revised: 04/16/2018 Document Reviewed: 04/16/2018 Elsevier Patient Education  2020 [employer]  

## 2019-07-31 NOTE — Progress Notes (Signed)
Obstetrics and Gynecology Annual Patient Evaluation  Appointment Date: 07/31/2019  OBGYN Clinic: [facility]  Primary Care Provider: Lin Willis  Chief Complaint:  Chief Complaint  Patient presents with  . Gynecologic Exam    History of Present Illness: Heidi Willis is a 33 y.o. African-American [id number] (LMP: two years ago), seen for the above chief complaint.  Patient last seen in 2017 and is currently doing well and w/o complaints   No breast s/s, abdominal pain, dysuria, vaginal itching, dyspareunia, vaginal discharge  Review of Systems: as noted in the History of Present Illness.  Past Medical History:  Past Medical History:  Diagnosis Date  . BMI 50.0-59.9, adult ([place])   . Bronchitis    Hx  . Headache(784.0)    otc meds prn  . Hypertension    with 2015 pregnancy    Past Surgical History:  Past Surgical History:  Procedure Laterality Date  . CESAREAN SECTION    . CESAREAN SECTION WITH BILATERAL TUBAL LIGATION Bilateral 01/15/2014   Procedure: CESAREAN SECTION WITH BILATERAL TUBAL LIGATION;  Surgeon: Heidi Pearson, MD;  Location: [facility];  Service: Obstetrics;  Laterality: Bilateral;  PRIMARY EDC 2/15  . DILATATION & CURETTAGE/HYSTEROSCOPY WITH MYOSURE N/A 07/01/2016   Procedure: HYSTEROSCOPY WITH MYOSURE;  Surgeon: Heidi Halim, MD;  Location: [facility];  Service: Gynecology;  Laterality: N/A;  . ELBOW SURGERY     right  . KNEE ARTHROSCOPY Left    twice  . POLYPECTOMY N/A 07/01/2016   Procedure: POLYPECTOMY;  Surgeon: Heidi Halim, MD;  Location: [facility];  Service: Gynecology;  Laterality: N/A;  . TONSILLECTOMY    . WISDOM TOOTH EXTRACTION  09/28/2016    Past Obstetrical History:  OB History  Gravida Para Term Preterm AB Living  2 2 2     2   SAB TAB Ectopic Multiple Live Births          1    # Outcome Date GA Lbr Len/2nd Weight Sex Delivery Anes PTL Lv  2 Term 2010 [redacted]w[redacted]d  5 lb 14 oz (2.665 kg) M Vag-Spont   LIV     Birth  Comments: induction  1 Term             Obstetric Comments  SVD x1. c-section x 1 for breech (with BTL)    Past Gynecological History: As per HPI. Periods: none since 2017 hysteroscopy, polypectomy and Liletta IUD insertion History of Pap Smear(s): Yes.   Last pap 2017, which was negative and hpv negative   Social History:  Social History   Socioeconomic History  . Marital status: Single    Spouse name: Not on file  . Number of children: Not on file  . Years of education: Not on file  . Highest education level: Not on file  Occupational History  . Not on file  Social Needs  . Financial resource strain: Not on file  . Food insecurity    Worry: Not on file    Inability: Not on file  . Transportation needs    Medical: Not on file    Non-medical: Not on file  Tobacco Use  . Smoking status: Never Smoker  . Smokeless tobacco: Never Used  Substance and Sexual Activity  . Alcohol use: No  . Drug use: No  . Sexual activity: Yes    Birth control/protection: Surgical  Lifestyle  . Physical activity    Days per week: Not on file    Minutes per session: Not on file  .  Stress: Not on file  Relationships  . Social Herbalist on phone: Not on file    Gets together: Not on file    Attends religious service: Not on file    Active member of club or organization: Not on file    Attends meetings of clubs or organizations: Not on file    Relationship status: Not on file  . Intimate partner violence    Fear of current or ex partner: Not on file    Emotionally abused: Not on file    Physically abused: Not on file    Forced sexual activity: Not on file  Other Topics Concern  . Not on file  Social History Narrative  . Not on file    Family History:  Family History  Problem Relation Age of Onset  . Heart failure Mother   . Healthy Father    She denies any female cancers  Medications Heidi Willis had no medications administered during this visit. Current  Outpatient Medications  Medication Sig Dispense Refill  . Cholecalciferol (VITAMIN D3) 5000 units CAPS Take 1 capsule by mouth daily.    . cloNIDine (CATAPRES) 0.2 MG tablet Take 0.2 mg by mouth daily.    . ferrous sulfate 325 (65 FE) MG tablet Take 325 mg by mouth daily with breakfast.    . ibuprofen (MOTRIN IB) 200 MG tablet Take 3 tablets (600 mg total) by mouth every 6 (six) hours as needed. 30 tablet 0  . metoprolol (LOPRESSOR) 100 MG tablet Take 100 mg by mouth 2 (two) times daily.    Marland Kitchen triamterene-hydrochlorothiazide (DYAZIDE) 37.5-25 MG capsule Take 1 capsule by mouth daily.    Marland Kitchen acetaminophen (TYLENOL) 500 MG tablet Take 1 tablet (500 mg total) by mouth every 6 (six) hours as needed. (Patient taking differently: Take 1,000 mg by mouth every 6 (six) hours as needed for mild pain. ) 30 tablet 0  . benzonatate (TESSALON) 200 MG capsule Take 1 capsule (200 mg total) by mouth every 8 (eight) hours. (Patient not taking: Reported on 07/31/2019) 21 capsule 0  . cetirizine (ZYRTEC) 10 MG tablet Take 10 mg by mouth daily.    . diphenhydramine-acetaminophen (TYLENOL PM) 25-500 MG TABS tablet Take 2 tablets by mouth at bedtime as needed (For sleep.).    Marland Kitchen olopatadine (PATANOL) 0.1 % ophthalmic solution Place 1 drop into both eyes 2 (two) times daily. (Patient not taking: Reported on 07/31/2019) 5 mL 0  . trimethoprim-polymyxin b (POLYTRIM) ophthalmic solution Place 1 drop into the left eye every 4 (four) hours. (Patient not taking: Reported on 07/31/2019) 10 mL 0   No current facility-administered medications for this visit.     Allergies Percocet [oxycodone-acetaminophen]   Physical Exam:  BP (!) 154/92   Pulse (!) 56   Wt (!) 330 lb 9.6 oz (150 kg)   BMI 53.36 kg/m  Body mass index is 53.36 kg/m. General appearance: Well nourished, well developed female in no acute distress.  Neck:  Supple, normal appearance, and no thyromegaly  Cardiovascular: normal s1 and s2.  No murmurs, rubs or  gallops. Respiratory:  Clear to auscultation bilateral. Normal respiratory effort Abdomen: positive bowel sounds and no masses, hernias; diffusely non tender to palpation, non distended Breasts: breasts appear normal, no suspicious masses, no skin or nipple changes or axillary nodes, and normal palpation. Neuro/Psych:  Normal mood and affect.  Skin:  Warm and dry.  Lymphatic:  No inguinal lymphadenopathy.   Pelvic exam: is limited  by body habitus EGBUS: within normal limits except for mild b/l erythema on labia majora. Vagina: normal, with no blood in vault. +white cottage cheese like discharge in the vault, Cervix: normal appearing cervix without tenderness, discharge or lesions. IUD strings 3-4cm tucked into fornices. Uterus:  nonenlarged and non tender and Adnexa:  normal adnexa and no mass, fullness, tenderness Rectovaginal: deferred  Laboratory: none  Radiology: none  Assessment:  Pt doing well  Plan: Routine care. Diflucan for yeast. Pt denies any STI testing. Recommend repeat pap smear in 2022  RTC PRN.   Durene Romans MD Attending [facility] for [employer] [facility])

## 2021-10-06 ENCOUNTER — Other Ambulatory Visit: Payer: Self-pay

## 2021-10-06 ENCOUNTER — Ambulatory Visit (HOSPITAL_COMMUNITY)
Admission: EM | Admit: 2021-10-06 | Discharge: 2021-10-06 | Disposition: A | Discharge status: Home / Self Care | Payer: 59

## 2021-10-06 ENCOUNTER — Encounter (HOSPITAL_COMMUNITY): Payer: Self-pay

## 2021-10-06 DIAGNOSIS — H66001 Acute suppurative otitis media without spontaneous rupture of ear drum, right ear: Secondary | ICD-10-CM

## 2021-10-06 DIAGNOSIS — J0140 Acute pansinusitis, unspecified: Secondary | ICD-10-CM | POA: Diagnosis not present

## 2021-10-06 MED ORDER — AMOXICILLIN-POT CLAVULANATE 875-125 MG PO TABS: 1.0000 | ORAL_TABLET | Freq: Two times a day (BID) | ORAL | 0 refills | Status: DC

## 2021-10-06 MED ORDER — FLUTICASONE PROPIONATE 50 MCG/ACT NA SUSP: 1.0000 | Freq: Every day | NASAL | 0 refills | Status: DC

## 2021-10-06 NOTE — ED Provider Notes (Signed)
[facility]    CSN: [id number] Arrival date & time: 10/06/21  1001      History   Chief Complaint Chief Complaint  Patient presents with   Otalgia   Cough    HPI Heidi Willis is a 35 y.o. female.   Patient presents today with a 2-day history of right ear pain.  Reports pain is rated 8 on a 0-10 pain scale but was a 10 when she first woke up, described as throbbing, no aggravating leaving factors identified.  She has not tried any over-the-counter medication for symptom management.  Reports initially she had a URI with cough and congestion but cough symptoms have improved though she continues to have some sinus congestion.  Denies any fever, diarrhea, changes in hearing, nausea, vomiting, body aches, headache.  Denies history of recurrent ear infections.  Denies any recent antibiotic use.  She denies any recent swelling or airplane travel.  Denies history of allergies; does have history of asthma she was younger but has grown out of this and not required albuterol during adulthood.   Past Medical History:  Diagnosis Date   Abnormal uterine bleeding (AUB) 06/10/2016   BMI 50.0-59.9, adult ([facility])    Bronchitis    Hx   Headache(784.0)    otc meds prn   Hypertension    with 2015 pregnancy    There are no problems to display for this patient.   Past Surgical History:  Procedure Laterality Date   CESAREAN SECTION     CESAREAN SECTION WITH BILATERAL TUBAL LIGATION Bilateral 01/15/2014   Procedure: CESAREAN SECTION WITH BILATERAL TUBAL LIGATION;  Surgeon: Marylynn Pearson, MD;  Location: [facility];  Service: Obstetrics;  Laterality: Bilateral;  PRIMARY EDC 2/15   DILATATION & CURETTAGE/HYSTEROSCOPY WITH MYOSURE N/A 07/01/2016   Procedure: HYSTEROSCOPY WITH MYOSURE;  Surgeon: Aletha Halim, MD;  Location: [facility];  Service: Gynecology;  Laterality: N/A;   ELBOW SURGERY     right   KNEE ARTHROSCOPY Left    twice   POLYPECTOMY N/A 07/01/2016   Procedure: POLYPECTOMY;   Surgeon: Aletha Halim, MD;  Location: [facility];  Service: Gynecology;  Laterality: N/A;   TONSILLECTOMY     WISDOM TOOTH EXTRACTION  09/28/2016    OB History     Gravida  2   Para  2   Term  2   Preterm      AB      Living  2      SAB      IAB      Ectopic      Multiple      Live Births  1        Obstetric Comments  SVD x1. c-section x 1 for breech (with BTL)          Home Medications    Prior to Admission medications   Medication Sig Start Date End Date Taking? Authorizing Provider  amoxicillin-clavulanate (AUGMENTIN) 875-125 MG tablet Take 1 tablet by mouth every 12 (twelve) hours. 10/06/21  Yes Jamaia Brum K, PA-C  fluticasone (FLONASE) 50 MCG/ACT nasal spray Place 1 spray into both nostrils daily. 10/06/21  Yes Arlis Yale K, PA-C  naproxen (NAPROSYN) 250 MG tablet Take by mouth 2 (two) times daily with a meal.   Yes [provider]  acetaminophen (TYLENOL) 500 MG tablet Take 1 tablet (500 mg total) by mouth every 6 (six) hours as needed. Patient taking differently: Take 1,000 mg by mouth every 6 (six) hours as  needed for mild pain. 01/23/16   Gloriann Loan, PA-C  benzonatate (TESSALON) 200 MG capsule Take 1 capsule (200 mg total) by mouth every 8 (eight) hours. Patient not taking: No sig reported 04/12/18   Wieters, Hallie C, PA-C  cetirizine (ZYRTEC) 10 MG tablet Take 10 mg by mouth daily.    [provider]  Cholecalciferol (VITAMIN D3) 5000 units CAPS Take 1 capsule by mouth daily.    [provider]  cloNIDine (CATAPRES) 0.2 MG tablet Take 0.2 mg by mouth daily.    [provider]  diphenhydramine-acetaminophen (TYLENOL PM) 25-500 MG TABS tablet Take 2 tablets by mouth at bedtime as needed (For sleep.).    [provider]  ferrous sulfate 325 (65 FE) MG tablet Take 325 mg by mouth daily with breakfast.    [provider]  ibuprofen (MOTRIN IB) 200 MG tablet Take 3 tablets (600 mg total) by mouth  every 6 (six) hours as needed. 07/01/16   Aletha Halim, MD  levonorgestrel (LILETTA, 52 MG,) 19.5 MCG/DAY IUD IUD 1 each by Intrauterine route once. 11/11/16   [provider]  metoprolol (LOPRESSOR) 100 MG tablet Take 100 mg by mouth 2 (two) times daily.    [provider]  olopatadine (PATANOL) 0.1 % ophthalmic solution Place 1 drop into both eyes 2 (two) times daily. Patient not taking: No sig reported 04/12/18   Wieters, Hallie C, PA-C  triamterene-hydrochlorothiazide (DYAZIDE) 37.5-25 MG capsule Take 1 capsule by mouth daily.    [provider]  trimethoprim-polymyxin b (POLYTRIM) ophthalmic solution Place 1 drop into the left eye every 4 (four) hours. Patient not taking: No sig reported 05/17/19   Hall-Potvin, [place], PA-C    Family History Family History  Problem Relation Age of Onset   Heart failure Mother    Healthy Father     Social History Social History   Tobacco Use   Smoking status: Never   Smokeless tobacco: Never  Vaping Use   Vaping Use: Never used  Substance Use Topics   Alcohol use: No   Drug use: No     Allergies   Percocet [oxycodone-acetaminophen]   Review of Systems Review of Systems  Constitutional:  Positive for activity change. Negative for appetite change, fatigue and fever.  HENT:  Positive for congestion, ear pain and sinus pressure. Negative for sneezing and sore throat.   Respiratory:  Negative for cough and shortness of breath.   Cardiovascular:  Negative for chest pain.  Gastrointestinal:  Negative for abdominal pain, diarrhea, nausea and vomiting.  Musculoskeletal:  Negative for arthralgias and myalgias.  Neurological:  Negative for dizziness, light-headedness and headaches.    Physical Exam Triage Vital Signs ED Triage Vitals  Enc Vitals Group     BP 10/06/21 1134 (!) 160/101     Pulse Rate 10/06/21 1134 70     Resp 10/06/21 1134 18     Temp 10/06/21 1134 98.1 F (36.7 C)     Temp Source 10/06/21 1134  Oral     SpO2 10/06/21 1134 98 %     Weight --      Height --      Head Circumference --      Peak Flow --      Pain Score 10/06/21 1132 7     Pain Loc --      Pain Edu? --      Excl. in [place]? --    No data found.  Updated Vital Signs BP (!) 160/101 (  BP Location: Right Wrist)   Pulse 70   Temp 98.1 F (36.7 C) (Oral)   Resp 18   SpO2 98%   Visual Acuity Right Eye Distance:   Left Eye Distance:   Bilateral Distance:    Right Eye Near:   Left Eye Near:    Bilateral Near:     Physical Exam Vitals reviewed.  Constitutional:      General: She is awake. She is not in acute distress.    Appearance: Normal appearance. She is well-developed. She is not ill-appearing.     Comments: Very pleasant female appears stated age in no acute distress sitting comfortably in exam room  HENT:     Head: Normocephalic and atraumatic.     Right Ear: Ear canal and external ear normal. Tympanic membrane is erythematous and bulging.     Left Ear: Tympanic membrane, ear canal and external ear normal. Tympanic membrane is not erythematous or bulging.     Nose:     Right Sinus: Maxillary sinus tenderness present. No frontal sinus tenderness.     Left Sinus: Maxillary sinus tenderness present. No frontal sinus tenderness.     Mouth/Throat:     Pharynx: Uvula midline. No oropharyngeal exudate or posterior oropharyngeal erythema.  Cardiovascular:     Rate and Rhythm: Normal rate and regular rhythm.     Heart sounds: Normal heart sounds, S1 normal and S2 normal. No murmur heard. Pulmonary:     Effort: Pulmonary effort is normal.     Breath sounds: Normal breath sounds. No wheezing, rhonchi or rales.     Comments: Clear to auscultation bilaterally Psychiatric:        Behavior: Behavior is cooperative.     UC Treatments / Results  Labs (all labs ordered are listed, but only abnormal results are displayed) Labs Reviewed - No data to display  EKG   Radiology No results  found.  Procedures Procedures (including critical care time)  Medications Ordered in UC Medications - No data to display  Initial Impression / Assessment and Plan / UC Course  I have reviewed the triage vital signs and the nursing notes.  Pertinent labs & imaging results that were available during my care of the patient were reviewed by me and considered in my medical decision making (see chart for details).     Otitis media identified on physical exam likely secondary to sinus infection from recent URI.  Patient started on Augmentin twice daily for 7 days.  She has no concern for pregnancy.  Recommended she use over-the-counter medications including Flonase and Tylenol/ibuprofen.  She is to rest and drink plenty of fluid.  Discussed alarm symptoms that warrant emergent evaluation including increased pain, change in hearing, fever, headache, nausea, vomiting.  Strict return precautions given to which she expressed understanding.  Final Clinical Impressions(s) / UC Diagnoses   Final diagnoses:  Acute non-recurrent pansinusitis  Non-recurrent acute suppurative otitis media of right ear without spontaneous rupture of tympanic membrane     Discharge Instructions      You have an ear infection.  I am also concerned that you have a sinus infection.  Please take Augmentin to cover for infection.  Alternate Tylenol ibuprofen as needed for pain.  Use Mucinex as well as prescribed Flonase to help with congestion.  If you have any worsening symptoms you need to return for reevaluation.     ED Prescriptions     Medication Sig Dispense Auth. Provider   amoxicillin-clavulanate (AUGMENTIN) 875-125 MG  tablet Take 1 tablet by mouth every 12 (twelve) hours. 14 tablet Margaurite Salido K, PA-C   fluticasone (FLONASE) 50 MCG/ACT nasal spray Place 1 spray into both nostrils daily. 16 g Trygg Mantz K, PA-C      PDMP not reviewed this encounter.   Terrilee Croak, PA-C 10/06/21 1146

## 2021-10-06 NOTE — ED Triage Notes (Signed)
Pt reports cough and nasal congestion x 1 week; right ear pain x 2 days.

## 2021-10-06 NOTE — Discharge Instructions (Signed)
You have an ear infection.  I am also concerned that you have a sinus infection.  Please take Augmentin to cover for infection.  Alternate Tylenol ibuprofen as needed for pain.  Use Mucinex as well as prescribed Flonase to help with congestion.  If you have any worsening symptoms you need to return for reevaluation.

## 2021-10-26 ENCOUNTER — Other Ambulatory Visit: Payer: Self-pay

## 2021-10-26 ENCOUNTER — Encounter (HOSPITAL_COMMUNITY): Payer: Self-pay

## 2021-10-26 ENCOUNTER — Ambulatory Visit (HOSPITAL_COMMUNITY)
Admission: EM | Admit: 2021-10-26 | Discharge: 2021-10-26 | Disposition: A | Discharge status: Home / Self Care | Payer: 59 | Attending: Family Medicine | Admitting: Family Medicine

## 2021-10-26 DIAGNOSIS — B349 Viral infection, unspecified: Secondary | ICD-10-CM

## 2021-10-26 LAB — RESPIRATORY PANEL BY PCR

## 2021-10-26 LAB — POC INFLUENZA A AND B ANTIGEN (URGENT CARE ONLY)
INFLUENZA A ANTIGEN, POC: NEGATIVE
INFLUENZA B ANTIGEN, POC: NEGATIVE

## 2021-10-26 NOTE — Discharge Instructions (Signed)
Your respiratory panel should result within the next 24 hours.  We will contact you to advise of any abnormal results.  If you are positive for certain viruses at times antiviral therapies can be prescribed to shorten the course of the illness.  In the meantime hydrate well with fluids.  Motrin for body aches and Tylenol as needed for fever.  Over-the-counter cough and cold combinations for any respiratory symptoms.

## 2021-10-26 NOTE — ED Triage Notes (Signed)
Pt reports cough, headache and body aches x 1 day.

## 2022-09-19 ENCOUNTER — Encounter (HOSPITAL_COMMUNITY): Payer: Self-pay | Admitting: Emergency Medicine

## 2022-09-19 ENCOUNTER — Ambulatory Visit (HOSPITAL_COMMUNITY)
Admission: EM | Admit: 2022-09-19 | Discharge: 2022-09-19 | Disposition: A | Discharge status: Home / Self Care | Payer: Commercial Managed Care - HMO | Attending: Emergency Medicine | Admitting: Emergency Medicine

## 2022-09-19 DIAGNOSIS — J069 Acute upper respiratory infection, unspecified: Secondary | ICD-10-CM

## 2022-09-19 MED ORDER — BENZONATATE 200 MG PO CAPS: 200.0000 mg | ORAL_CAPSULE | Freq: Three times a day (TID) | ORAL | 0 refills | Status: DC

## 2022-09-19 MED ORDER — PREDNISONE 20 MG PO TABS: 40.0000 mg | ORAL_TABLET | Freq: Every day | ORAL | 0 refills | Status: DC

## 2022-09-19 MED ORDER — PROMETHAZINE-DM 6.25-15 MG/5ML PO SYRP: 5.0000 mL | ORAL_SOLUTION | Freq: Four times a day (QID) | ORAL | 0 refills | Status: DC | PRN

## 2022-09-19 MED ORDER — AMOXICILLIN-POT CLAVULANATE 875-125 MG PO TABS: 1.0000 | ORAL_TABLET | Freq: Two times a day (BID) | ORAL | 0 refills | Status: AC

## 2022-09-19 NOTE — Discharge Instructions (Signed)
Your symptoms have persisted for 2 weeks without resolution we will provide bacterial coverage  Take Augmentin every morning and every evening for 10 days ideally you will begin to see improvement after 48 hours of using medication  Starting tomorrow begin prednisone every morning with food for 5 days, this will reduce inflammation to the upper airways and will reduce your wheezing  You may use Tessalon pill every 8 hours to help calm your coughing  You may use cough syrup every 6 hours as needed for additional comfort, be mindful this medication may make you drowsy     You can take Tylenol and/or Ibuprofen as needed for fever reduction and pain relief.   For cough: honey 1/2 to 1 teaspoon (you can dilute the honey in water or another fluid).  You can also use guaifenesin and dextromethorphan for cough. You can use a humidifier for chest congestion and cough.  If you don't have a humidifier, you can sit in the bathroom with the hot shower running.      For sore throat: try warm salt water gargles, cepacol lozenges, throat spray, warm tea or water with lemon/honey, popsicles or ice, or OTC cold relief medicine for throat discomfort.   For congestion: take a daily anti-histamine like Zyrtec, Claritin, and a oral decongestant, such as pseudoephedrine.  You can also use Flonase 1-2 sprays in each nostril daily.   It is important to stay hydrated: drink plenty of fluids (water, gatorade/powerade/pedialyte, juices, or teas) to keep your throat moisturized and help further relieve irritation/discomfort.

## 2022-09-19 NOTE — ED Triage Notes (Signed)
Pt has cough x 2 weeks, getting up phlegm. Having nasal congestion as well. Reports hears self wheeze in sleep at night. Tried Mucinex but not helping with congestion. Also had headaches over past couple weeks as well.

## 2022-09-19 NOTE — ED Provider Notes (Signed)
[name]    CSN: [id number] Arrival date & time: 09/19/22  1938      History   Chief Complaint Chief Complaint  Patient presents with   Cough    HPI Heidi Willis is a 36 y.o. female.   Patient presents with a nonproductive dry cough, nasal congestion, rhinorrhea and wheezing predominantly at nighttime for 2 weeks.  Works at a daycare with known sick contacts.  Has attempted use of Mucinex DM and pseudoephedrine which have been ineffective.  Tolerating food and liquids.  History of bronchitis.  Non-smoker.  No shortness of breath, chest pain or tightness, fever, chills or body aches.  Past Medical History:  Diagnosis Date   Abnormal uterine bleeding (AUB) 06/10/2016   BMI 50.0-59.9, adult (HCC)    Bronchitis    Hx   Headache(784.0)    otc meds prn   Hypertension    with 2015 pregnancy    There are no problems to display for this patient.   Past Surgical History:  Procedure Laterality Date   CESAREAN SECTION     CESAREAN SECTION WITH BILATERAL TUBAL LIGATION Bilateral 01/15/2014   Procedure: CESAREAN SECTION WITH BILATERAL TUBAL LIGATION;  Surgeon: Marylynn Pearson, MD;  Location: [facility];  Service: Obstetrics;  Laterality: Bilateral;  PRIMARY EDC 2/15   DILATATION & CURETTAGE/HYSTEROSCOPY WITH MYOSURE N/A 07/01/2016   Procedure: HYSTEROSCOPY WITH MYOSURE;  Surgeon: Aletha Halim, MD;  Location: [facility];  Service: Gynecology;  Laterality: N/A;   ELBOW SURGERY     right   KNEE ARTHROSCOPY Left    twice   POLYPECTOMY N/A 07/01/2016   Procedure: POLYPECTOMY;  Surgeon: Aletha Halim, MD;  Location: [facility];  Service: Gynecology;  Laterality: N/A;   TONSILLECTOMY     WISDOM TOOTH EXTRACTION  09/28/2016    OB History     Gravida  2   Para  2   Term  2   Preterm      AB      Living  2      SAB      IAB      Ectopic      Multiple      Live Births  1        Obstetric Comments  SVD x1. c-section x 1 for breech (with BTL)           Home Medications    Prior to Admission medications   Medication Sig Start Date End Date Taking? Authorizing Provider  acetaminophen (TYLENOL) 500 MG tablet Take 1 tablet (500 mg total) by mouth every 6 (six) hours as needed. Patient taking differently: Take 1,000 mg by mouth every 6 (six) hours as needed for mild pain. 01/23/16   Gloriann Loan, PA-C  amoxicillin-clavulanate (AUGMENTIN) 875-125 MG tablet Take 1 tablet by mouth every 12 (twelve) hours. 10/06/21   Raspet, Derry Skill, PA-C  benzonatate (TESSALON) 200 MG capsule Take 1 capsule (200 mg total) by mouth every 8 (eight) hours. Patient not taking: Reported on 07/31/2019 04/12/18   Wieters, Hallie C, PA-C  cetirizine (ZYRTEC) 10 MG tablet Take 10 mg by mouth daily.    [provider]  Cholecalciferol (VITAMIN D3) 5000 units CAPS Take 1 capsule by mouth daily.    [provider]  cloNIDine (CATAPRES) 0.2 MG tablet Take 0.2 mg by mouth daily.    [provider]  diphenhydramine-acetaminophen (TYLENOL PM) 25-500 MG TABS tablet Take 2 tablets by mouth at bedtime as needed (For sleep.).  [provider]  ferrous sulfate 325 (65 FE) MG tablet Take 325 mg by mouth daily with breakfast.    [provider]  fluticasone (FLONASE) 50 MCG/ACT nasal spray Place 1 spray into both nostrils daily. 10/06/21   Raspet, Derry Skill, PA-C  ibuprofen (MOTRIN IB) 200 MG tablet Take 3 tablets (600 mg total) by mouth every 6 (six) hours as needed. 07/01/16   Aletha Halim, MD  levonorgestrel (LILETTA, 52 MG,) 19.5 MCG/DAY IUD IUD 1 each by Intrauterine route once. 11/11/16   [provider]  metoprolol (LOPRESSOR) 100 MG tablet Take 100 mg by mouth 2 (two) times daily.    [provider]  naproxen (NAPROSYN) 250 MG tablet Take by mouth 2 (two) times daily with a meal.    [provider]  olopatadine (PATANOL) 0.1 % ophthalmic solution Place 1 drop into both eyes 2 (two) times daily. Patient not  taking: No sig reported 04/12/18   Wieters, Hallie C, PA-C  triamterene-hydrochlorothiazide (DYAZIDE) 37.5-25 MG capsule Take 1 capsule by mouth daily.    [provider]  trimethoprim-polymyxin b (POLYTRIM) ophthalmic solution Place 1 drop into the left eye every 4 (four) hours. Patient not taking: No sig reported 05/17/19   Hall-Potvin, [place], PA-C    Family History Family History  Problem Relation Age of Onset   Heart failure Mother    Healthy Father     Social History Social History   Tobacco Use   Smoking status: Never   Smokeless tobacco: Never  Vaping Use   Vaping Use: Never used  Substance Use Topics   Alcohol use: No   Drug use: No     Allergies   Percocet [oxycodone-acetaminophen]   Review of Systems Review of Systems Defer to HPI    Physical Exam Triage Vital Signs ED Triage Vitals  Enc Vitals Group     BP 09/19/22 2009 (!) 130/92     Pulse Rate 09/19/22 2009 62     Resp 09/19/22 2009 17     Temp 09/19/22 2009 98.1 F (36.7 C)     Temp Source 09/19/22 2009 Oral     SpO2 09/19/22 2009 99 %     Weight --      Height --      Head Circumference --      Peak Flow --      Pain Score 09/19/22 2008 2     Pain Loc --      Pain Edu? --      Excl. in [place]? --    No data found.  Updated Vital Signs BP (!) 130/92 (BP Location: Right Arm)   Pulse 62   Temp 98.1 F (36.7 C) (Oral)   Resp 17   LMP 08/30/2022   SpO2 99%   Visual Acuity Right Eye Distance:   Left Eye Distance:   Bilateral Distance:    Right Eye Near:   Left Eye Near:    Bilateral Near:     Physical Exam Constitutional:      Appearance: Normal appearance.  HENT:     Head: Normocephalic.     Right Ear: Tympanic membrane, ear canal and external ear normal.     Left Ear: Tympanic membrane, ear canal and external ear normal.     Nose: Congestion and rhinorrhea present.     Mouth/Throat:     Mouth: Mucous membranes are moist.     Pharynx: Oropharynx is clear.  Eyes:      Extraocular Movements:  Extraocular movements intact.  Cardiovascular:     Rate and Rhythm: Normal rate and regular rhythm.     Pulses: Normal pulses.     Heart sounds: Normal heart sounds.  Pulmonary:     Effort: Pulmonary effort is normal.     Breath sounds: Normal breath sounds.  Musculoskeletal:     Cervical back: Normal range of motion and neck supple.  Skin:    General: Skin is warm and dry.  Neurological:     Mental Status: She is alert and oriented to person, place, and time. Mental status is at baseline.  Psychiatric:        Mood and Affect: Mood normal.        Behavior: Behavior normal.      UC Treatments / Results  Labs (all labs ordered are listed, but only abnormal results are displayed) Labs Reviewed - No data to display  EKG   Radiology No results found.  Procedures Procedures (including critical care time)  Medications Ordered in UC Medications - No data to display  Initial Impression / Assessment and Plan / UC Course  I have reviewed the triage vital signs and the nursing notes.  Pertinent labs & imaging results that were available during my care of the patient were reviewed by me and considered in my medical decision making (see chart for details).  Acute upper respiratory infection  Patient is in no signs of distress nor toxic appearing.  Vital signs are stable.  Prescribed Augmentin, prednisone, Tessalon and Promethazine DM for outpatient management   May use additional over-the-counter medications as needed for supportive care.  May follow-up with urgent care as needed if symptoms persist or worsen.  Note given.   Final Clinical Impressions(s) / UC Diagnoses   Final diagnoses:  Acute upper respiratory infection     Discharge Instructions      Your symptoms have persisted for 2 weeks without resolution we will provide bacterial coverage  Take Augmentin every morning and every evening for 10 days ideally you will begin to see  improvement after 48 hours of using medication  Starting tomorrow begin prednisone every morning with food for 5 days, this will reduce inflammation to the upper airways and will reduce your wheezing  You may use Tessalon pill every 8 hours to help calm your coughing  You may use cough syrup every 6 hours as needed for additional comfort, be mindful this medication may make you drowsy     You can take Tylenol and/or Ibuprofen as needed for fever reduction and pain relief.   For cough: honey 1/2 to 1 teaspoon (you can dilute the honey in water or another fluid).  You can also use guaifenesin and dextromethorphan for cough. You can use a humidifier for chest congestion and cough.  If you don't have a humidifier, you can sit in the bathroom with the hot shower running.      For sore throat: try warm salt water gargles, cepacol lozenges, throat spray, warm tea or water with lemon/honey, popsicles or ice, or OTC cold relief medicine for throat discomfort.   For congestion: take a daily anti-histamine like Zyrtec, Claritin, and a oral decongestant, such as pseudoephedrine.  You can also use Flonase 1-2 sprays in each nostril daily.   It is important to stay hydrated: drink plenty of fluids (water, gatorade/powerade/pedialyte, juices, or teas) to keep your throat moisturized and help further relieve irritation/discomfort.    ED Prescriptions   None    PDMP not  reviewed this encounter.   Hans Eden, NP 09/20/22 1008

## 2022-11-17 ENCOUNTER — Ambulatory Visit (INDEPENDENT_AMBULATORY_CARE_PROVIDER_SITE_OTHER): Payer: Medicaid Other

## 2022-11-17 ENCOUNTER — Ambulatory Visit (HOSPITAL_COMMUNITY)
Admission: EM | Admit: 2022-11-17 | Discharge: 2022-11-17 | Disposition: A | Discharge status: Home / Self Care | Payer: Medicaid Other | Attending: Emergency Medicine | Admitting: Emergency Medicine

## 2022-11-17 ENCOUNTER — Encounter (HOSPITAL_COMMUNITY): Payer: Self-pay

## 2022-11-17 DIAGNOSIS — M25511 Pain in right shoulder: Secondary | ICD-10-CM | POA: Diagnosis not present

## 2022-11-17 DIAGNOSIS — M79601 Pain in right arm: Secondary | ICD-10-CM

## 2022-11-17 MED ORDER — METHOCARBAMOL 500 MG PO TABS: 500.0000 mg | ORAL_TABLET | Freq: Two times a day (BID) | ORAL | 0 refills | Status: DC

## 2022-11-17 NOTE — ED Triage Notes (Signed)
Pt c/o rt arm and shoulder pain since yesterday. Denies injury. States has decrease movement. Denies taking any OTC meds.

## 2022-11-17 NOTE — Discharge Instructions (Signed)
X-ray did not show any fracture You will need to call orthopedic today for a follow-up appointment and possible CT scan We discussed wearing an arm sling temporarily keep elevated to decrease any swelling or edema to the hand No lifting pulling or use of the right arm until followed up by orthopedic Use heat as needed take NSAIDs as needed for pain Use the muscle relaxer do not drive

## 2022-11-17 NOTE — ED Provider Notes (Signed)
Heidi Willis    CSN: [id number] Arrival date & time: 11/17/22  0815      History   Chief Complaint Chief Complaint  Patient presents with   Arm Pain    HPI Heidi Willis is a 36 y.o. female.   HPI  Past Medical History:  Diagnosis Date   Abnormal uterine bleeding (AUB) 06/10/2016   BMI 50.0-59.9, adult (HCC)    Bronchitis    Hx   Headache(784.0)    otc meds prn   Hypertension    with 2015 pregnancy    There are no problems to display for this patient.   Past Surgical History:  Procedure Laterality Date   CESAREAN SECTION     CESAREAN SECTION WITH BILATERAL TUBAL LIGATION Bilateral 01/15/2014   Procedure: CESAREAN SECTION WITH BILATERAL TUBAL LIGATION;  Surgeon: Marylynn Pearson, MD;  Location: [facility];  Service: Obstetrics;  Laterality: Bilateral;  PRIMARY EDC 2/15   DILATATION & CURETTAGE/HYSTEROSCOPY WITH MYOSURE N/A 07/01/2016   Procedure: HYSTEROSCOPY WITH MYOSURE;  Surgeon: Aletha Halim, MD;  Location: [facility];  Service: Gynecology;  Laterality: N/A;   ELBOW SURGERY     right   KNEE ARTHROSCOPY Left    twice   POLYPECTOMY N/A 07/01/2016   Procedure: POLYPECTOMY;  Surgeon: Aletha Halim, MD;  Location: [facility];  Service: Gynecology;  Laterality: N/A;   TONSILLECTOMY     WISDOM TOOTH EXTRACTION  09/28/2016    OB History     Gravida  2   Para  2   Term  2   Preterm      AB      Living  2      SAB      IAB      Ectopic      Multiple      Live Births  1        Obstetric Comments  SVD x1. c-section x 1 for breech (with BTL)          Home Medications    Prior to Admission medications   Medication Sig Start Date End Date Taking? Authorizing Provider  methocarbamol (ROBAXIN) 500 MG tablet Take 1 tablet (500 mg total) by mouth 2 (two) times daily. 11/17/22  Yes Marney Setting, NP  cetirizine (ZYRTEC) 10 MG tablet Take 10 mg by mouth daily.    [provider]  Cholecalciferol (VITAMIN D3) 5000 units CAPS  Take 1 capsule by mouth daily.    [provider]  cloNIDine (CATAPRES) 0.2 MG tablet Take 0.2 mg by mouth daily.    [provider]  diphenhydramine-acetaminophen (TYLENOL PM) 25-500 MG TABS tablet Take 2 tablets by mouth at bedtime as needed (For sleep.).    [provider]  ferrous sulfate 325 (65 FE) MG tablet Take 325 mg by mouth daily with breakfast.    [provider]  ibuprofen (MOTRIN IB) 200 MG tablet Take 3 tablets (600 mg total) by mouth every 6 (six) hours as needed. 07/01/16   Aletha Halim, MD  levonorgestrel (LILETTA, 52 MG,) 19.5 MCG/DAY IUD IUD 1 each by Intrauterine route once. 11/11/16   [provider]  metoprolol (LOPRESSOR) 100 MG tablet Take 100 mg by mouth 2 (two) times daily.    [provider]  naproxen (NAPROSYN) 250 MG tablet Take by mouth 2 (two) times daily with a meal.    [provider]  olopatadine (PATANOL) 0.1 % ophthalmic solution Place 1 drop into both eyes 2 (two) times  daily. Patient not taking: No sig reported 04/12/18   Wieters, Hallie C, PA-C  triamterene-hydrochlorothiazide (DYAZIDE) 37.5-25 MG capsule Take 1 capsule by mouth daily.    [provider]  trimethoprim-polymyxin b (POLYTRIM) ophthalmic solution Place 1 drop into the left eye every 4 (four) hours. Patient not taking: No sig reported 05/17/19   Hall-Potvin, [place], PA-C    Family History Family History  Problem Relation Age of Onset   Heart failure Mother    Healthy Father     Social History Social History   Tobacco Use   Smoking status: Never   Smokeless tobacco: Never  Vaping Use   Vaping Use: Never used  Substance Use Topics   Alcohol use: No   Drug use: No     Allergies   Percocet [oxycodone-acetaminophen]   Review of Systems Review of Systems  Constitutional: Negative.   Respiratory: Negative.    Cardiovascular: Negative.   Gastrointestinal: Negative.   Musculoskeletal:  Positive for joint  swelling.       Pain to right shoulder and right bicep area after waking this morning unable to lift or extend arm  Skin: Negative.   Neurological: Negative.      Physical Exam Triage Vital Signs ED Triage Vitals  Enc Vitals Group     BP 11/17/22 0928 (!) 143/73     Pulse Rate 11/17/22 0928 (!) 57     Resp 11/17/22 0928 18     Temp 11/17/22 0928 98.1 F (36.7 C)     Temp Source 11/17/22 0928 Oral     SpO2 11/17/22 0928 100 %     Weight --      Height --      Head Circumference --      Peak Flow --      Pain Score 11/17/22 0929 8     Pain Loc --      Pain Edu? --      Excl. in [place]? --    No data found.  Updated Vital Signs BP (!) 143/73 (BP Location: Left Arm)   Pulse (!) 57   Temp 98.1 F (36.7 C) (Oral)   Resp 18   LMP 11/10/2022   SpO2 100%   Visual Acuity Right Eye Distance:   Left Eye Distance:   Bilateral Distance:    Right Eye Near:   Left Eye Near:    Bilateral Near:     Physical Exam Constitutional:      Appearance: Normal appearance.  Cardiovascular:     Rate and Rhythm: Normal rate.  Pulmonary:     Effort: Pulmonary effort is normal.  Abdominal:     General: Abdomen is flat.  Musculoskeletal:        General: Tenderness present. No deformity or signs of injury.     Cervical back: Normal range of motion.     Comments: Decreased range of motion to right shoulder and bicep unable to extend or lift fully due to discomfort no obvious deformity noted.  Warm to touch strong pulses no known injury  Skin:    General: Skin is warm.     Capillary Refill: Capillary refill takes less than 2 seconds.  Neurological:     General: No focal deficit present.     Mental Status: She is alert.      UC Treatments / Results  Labs (all labs ordered are listed, but only abnormal results are displayed) Labs Reviewed - No data to display  EKG   Radiology DG  Humerus Right  Result Date: 11/17/2022 CLINICAL DATA:  Right shoulder and arm pain spontaneous this  morning. No known injury. EXAM: RIGHT SHOULDER - 2+ VIEW; RIGHT HUMERUS - 2+ VIEW COMPARISON:  None Available. FINDINGS: Right shoulder: The glenohumeral and acromioclavicular joints are appropriately aligned. Minimal peripheral acromioclavicular degenerative osteophytes. The glenohumeral joint space is maintained. No acute fracture or dislocation. The visualized portion of the right lung is unremarkable. Right humerus: Normal bone mineralization.  No acute fracture. IMPRESSION: 1. Minimal acromioclavicular osteoarthritis. 2. No acute fracture. Electronically Signed   By: Yvonne Kendall M.D.   On: 11/17/2022 10:20   DG Shoulder Right  Result Date: 11/17/2022 CLINICAL DATA:  Right shoulder and arm pain spontaneous this morning. No known injury. EXAM: RIGHT SHOULDER - 2+ VIEW; RIGHT HUMERUS - 2+ VIEW COMPARISON:  None Available. FINDINGS: Right shoulder: The glenohumeral and acromioclavicular joints are appropriately aligned. Minimal peripheral acromioclavicular degenerative osteophytes. The glenohumeral joint space is maintained. No acute fracture or dislocation. The visualized portion of the right lung is unremarkable. Right humerus: Normal bone mineralization.  No acute fracture. IMPRESSION: 1. Minimal acromioclavicular osteoarthritis. 2. No acute fracture. Electronically Signed   By: Yvonne Kendall M.D.   On: 11/17/2022 10:20    Procedures Procedures (including critical care time)  Medications Ordered in UC Medications - No data to display  Initial Impression / Assessment and Plan / UC Course  I have reviewed the triage vital signs and the nursing notes.  Pertinent labs & imaging results that were available during my care of the patient were reviewed by me and considered in my medical decision making (see chart for details).     X-ray did not show any fracture You will need to call orthopedic today for a follow-up appointment and possible CT scan We discussed wearing an arm sling temporarily  keep elevated to decrease any swelling or edema to the hand No lifting pulling or use of the right arm until followed up by orthopedic Final Clinical Impressions(s) / UC Diagnoses   Final diagnoses:  Right arm pain     Discharge Instructions      X-ray did not show any fracture You will need to call orthopedic today for a follow-up appointment and possible CT scan We discussed wearing an arm sling temporarily keep elevated to decrease any swelling or edema to the hand No lifting pulling or use of the right arm until followed up by orthopedic Use heat as needed take NSAIDs as needed for pain Use the muscle relaxer do not drive     ED Prescriptions     Medication Sig Dispense Auth. Provider   methocarbamol (ROBAXIN) 500 MG tablet Take 1 tablet (500 mg total) by mouth 2 (two) times daily. 20 tablet Marney Setting, NP      PDMP not reviewed this encounter.   Marney Setting, NP 11/17/22 1201

## 2023-08-22 ENCOUNTER — Encounter (HOSPITAL_COMMUNITY): Payer: Self-pay

## 2023-08-22 ENCOUNTER — Ambulatory Visit (HOSPITAL_COMMUNITY)
Admission: EM | Admit: 2023-08-22 | Discharge: 2023-08-22 | Disposition: A | Discharge status: Home / Self Care | Payer: Medicaid Other

## 2023-08-22 DIAGNOSIS — R519 Headache, unspecified: Secondary | ICD-10-CM | POA: Diagnosis not present

## 2023-08-22 DIAGNOSIS — J0100 Acute maxillary sinusitis, unspecified: Secondary | ICD-10-CM

## 2023-08-22 DIAGNOSIS — R1030 Lower abdominal pain, unspecified: Secondary | ICD-10-CM | POA: Diagnosis not present

## 2023-08-22 MED ORDER — AMOXICILLIN-POT CLAVULANATE 875-125 MG PO TABS: 1.0000 | ORAL_TABLET | Freq: Two times a day (BID) | ORAL | 0 refills | Status: DC

## 2023-08-22 MED ORDER — ONDANSETRON 4 MG PO TBDP: 4.0000 mg | ORAL_TABLET | Freq: Three times a day (TID) | ORAL | 0 refills | Status: DC | PRN

## 2023-08-22 MED ORDER — BUTALBITAL-APAP-CAFFEINE 50-325-40 MG PO TABS: 1.0000 | ORAL_TABLET | Freq: Four times a day (QID) | ORAL | 0 refills | Status: DC | PRN

## 2023-08-22 NOTE — ED Provider Notes (Signed)
[facility]    CSN: [id number] Arrival date & time: 08/22/23  1940      History   Chief Complaint Chief Complaint  Patient presents with   Abdominal Pain    HPI Heidi Willis is a 37 y.o. female.   Patient presents with headache, nausea, abdominal pain since Saturday. Patient states he has been taking Tylenol and Excedrin with no relief of headache. Patient states her children had similar symptoms on Friday and tested negative for COVID.  Denies fever, shortness of breath, chest pain, and diarrhea.    Abdominal Pain Associated symptoms: chills, fatigue and nausea   Associated symptoms: no chest pain, no cough, no diarrhea, no fever, no shortness of breath, no sore throat and no vomiting     Past Medical History:  Diagnosis Date   Abnormal uterine bleeding (AUB) 06/10/2016   BMI 50.0-59.9, adult (HCC)    Bronchitis    Hx   Headache(784.0)    otc meds prn   Hypertension    with 2015 pregnancy    There are no problems to display for this patient.   Past Surgical History:  Procedure Laterality Date   CESAREAN SECTION     CESAREAN SECTION WITH BILATERAL TUBAL LIGATION Bilateral 01/15/2014   Procedure: CESAREAN SECTION WITH BILATERAL TUBAL LIGATION;  Surgeon: Zelphia Cairo, MD;  Location: [facility];  Service: Obstetrics;  Laterality: Bilateral;  PRIMARY EDC 2/15   DILATATION & CURETTAGE/HYSTEROSCOPY WITH MYOSURE N/A 07/01/2016   Procedure: HYSTEROSCOPY WITH MYOSURE;  Surgeon: [place] Bing, MD;  Location: [facility];  Service: Gynecology;  Laterality: N/A;   ELBOW SURGERY     right   KNEE ARTHROSCOPY Left    twice   POLYPECTOMY N/A 07/01/2016   Procedure: POLYPECTOMY;  Surgeon: [place] Bing, MD;  Location: [facility];  Service: Gynecology;  Laterality: N/A;   TONSILLECTOMY     WISDOM TOOTH EXTRACTION  09/28/2016    OB History     Gravida  2   Para  2   Term  2   Preterm      AB      Living  2      SAB      IAB      Ectopic      Multiple       Live Births  1        Obstetric Comments  SVD x1. c-section x 1 for breech (with BTL)          Home Medications    Prior to Admission medications   Medication Sig Start Date End Date Taking? Authorizing Provider  amoxicillin-clavulanate (AUGMENTIN) 875-125 MG tablet Take 1 tablet by mouth every 12 (twelve) hours. 08/22/23  Yes Wynonia Lawman A, NP  butalbital-acetaminophen-caffeine (FIORICET) [phone] MG tablet Take 1 tablet by mouth every 6 (six) hours as needed for up to 5 doses for headache. 08/22/23  Yes Wynonia Lawman A, NP  ondansetron (ZOFRAN-ODT) 4 MG disintegrating tablet Take 1 tablet (4 mg total) by mouth every 8 (eight) hours as needed for nausea or vomiting. 08/22/23  Yes Susann Givens, Najwa Spillane A, NP  amLODipine (NORVASC) 10 MG tablet Take 1 tablet by mouth daily.    [provider]  ibuprofen (MOTRIN IB) 200 MG tablet Take 3 tablets (600 mg total) by mouth every 6 (six) hours as needed. 07/01/16   [place] Bing, MD  levonorgestrel (LILETTA, 52 MG,) 19.5 MCG/DAY IUD IUD 1 each by Intrauterine route once. 11/11/16   [provider]  naproxen (NAPROSYN) 250 MG tablet Take by mouth 2 (two) times daily with a meal.    [provider]  triamterene-hydrochlorothiazide (DYAZIDE) 37.5-25 MG capsule Take 1 capsule by mouth daily.    [provider]  zolpidem (AMBIEN) 10 MG tablet Take 1 tablet by mouth daily.    [provider]    Family History Family History  Problem Relation Age of Onset   Heart failure Mother    Healthy Father     Social History Social History   Tobacco Use   Smoking status: Never   Smokeless tobacco: Never  Vaping Use   Vaping status: Never Used  Substance Use Topics   Alcohol use: No   Drug use: No     Allergies   Percocet [oxycodone-acetaminophen]   Review of Systems Review of Systems  Constitutional:  Positive for chills and fatigue. Negative for fever.  HENT:  Positive for congestion,  rhinorrhea, sinus pressure and sinus pain. Negative for sore throat and trouble swallowing.   Respiratory:  Negative for cough, chest tightness, shortness of breath and wheezing.   Cardiovascular:  Negative for chest pain.  Gastrointestinal:  Positive for abdominal pain and nausea. Negative for diarrhea and vomiting.  Skin:  Negative for color change.  Neurological:  Positive for headaches. Negative for dizziness, syncope, speech difficulty, weakness and light-headedness.     Physical Exam Triage Vital Signs ED Triage Vitals  Encounter Vitals Group     BP 08/22/23 2015 138/89     Systolic BP Percentile --      Diastolic BP Percentile --      Pulse Rate 08/22/23 2015 64     Resp 08/22/23 2015 16     Temp 08/22/23 2015 98.4 F (36.9 C)     Temp Source 08/22/23 2015 Oral     SpO2 08/22/23 2015 99 %     Weight 08/22/23 2015 194 lb (88 kg)     Height 08/22/23 2015 5\' 6"  (1.676 m)     Head Circumference --      Peak Flow --      Pain Score 08/22/23 2014 8     Pain Loc --      Pain Education --      Exclude from Growth Chart --    No data found.  Updated Vital Signs BP 138/89 (BP Location: Left Arm)   Pulse 64   Temp 98.4 F (36.9 C) (Oral)   Resp 16   Ht 5\' 6"  (1.676 m)   Wt 194 lb (88 kg)   SpO2 99%   BMI 31.31 kg/m   Visual Acuity Right Eye Distance:   Left Eye Distance:   Bilateral Distance:    Right Eye Near:   Left Eye Near:    Bilateral Near:     Physical Exam Vitals and nursing note reviewed.  Constitutional:      General: She is awake. She is not in acute distress.    Appearance: Normal appearance. She is well-developed and well-groomed. She is not ill-appearing or toxic-appearing.  HENT:     Nose: Congestion and rhinorrhea present.     Right Sinus: Maxillary sinus tenderness and frontal sinus tenderness present.     Left Sinus: Maxillary sinus tenderness and frontal sinus tenderness present.     Mouth/Throat:     Mouth: Mucous membranes are moist.      Pharynx: Posterior oropharyngeal erythema and postnasal drip present. No pharyngeal swelling or oropharyngeal exudate.     Tonsils:  No tonsillar exudate.  Eyes:     Extraocular Movements: Extraocular movements intact.     Pupils: Pupils are equal, round, and reactive to light.  Cardiovascular:     Rate and Rhythm: Normal rate.     Heart sounds: Normal heart sounds.  Pulmonary:     Effort: Pulmonary effort is normal. No respiratory distress.     Breath sounds: Normal breath sounds. No wheezing.  Abdominal:     General: Abdomen is flat. Bowel sounds are normal. There is no distension.     Palpations: Abdomen is soft. There is no mass.     Tenderness: There is abdominal tenderness in the right lower quadrant, periumbilical area and left lower quadrant. There is no right CVA tenderness, left CVA tenderness, guarding or rebound. Negative signs include Murphy's sign, Rovsing's sign and McBurney's sign.     Hernia: No hernia is present.  Skin:    General: Skin is warm and dry.  Neurological:     Mental Status: She is alert and oriented to person, place, and time.     GCS: GCS eye subscore is 4. GCS verbal subscore is 5. GCS motor subscore is 6.     Cranial Nerves: Cranial nerves 2-12 are intact.     Sensory: Sensation is intact.     Motor: Motor function is intact.     Coordination: Coordination is intact.     Gait: Gait is intact.  Psychiatric:        Behavior: Behavior is cooperative.      UC Treatments / Results  Labs (all labs ordered are listed, but only abnormal results are displayed) Labs Reviewed - No data to display  EKG   Radiology No results found.  Procedures Procedures (including critical care time)  Medications Ordered in UC Medications - No data to display  Initial Impression / Assessment and Plan / UC Course  I have reviewed the triage vital signs and the nursing notes.  Pertinent labs & imaging results that were available during my care of the patient  were reviewed by me and considered in my medical decision making (see chart for details).     Patient presented with headache, nausea, and abdominal pain since Saturday.  Patient states she has been taking Tylenol and Excedrin with no relief of headache.  Patient reports children had similar symptoms on Friday and tested negative for COVID. Denies fever, shortness of breath, chest pain, dizziness, weakness, confusion, blurred vision, and diarrhea. Upon assessment patient is tender to frontal and maxillary sinuses. Patient also reports green mucus from nose. Patient is mildly tender to lower abdomen. Prescribed Augmentin for sinusitis. Prescribed a few Fioricet for intractable headache and Zofran for nausea. Discussed return and emergency department precautions. Final Clinical Impressions(s) / UC Diagnoses   Final diagnoses:  Acute non-recurrent maxillary sinusitis  Acute intractable headache, unspecified headache type  Lower abdominal pain     Discharge Instructions      Please start taking antibiotic as prescribed until finished. You can take Fiorcet as needed for headache unrelieved by Tylenol and Ibuprofen. You can take Zofran as needed for nausea/vomiting. If symptoms persist return here or follow-up with primary care doctor. If symptoms worsen please go to ER.     ED Prescriptions     Medication Sig Dispense Auth. Provider   amoxicillin-clavulanate (AUGMENTIN) 875-125 MG tablet Take 1 tablet by mouth every 12 (twelve) hours. 14 tablet Susann Givens, Maleyah Evans A, NP   ondansetron (ZOFRAN-ODT) 4 MG disintegrating tablet Take 1  tablet (4 mg total) by mouth every 8 (eight) hours as needed for nausea or vomiting. 10 tablet Wynonia Lawman A, NP   butalbital-acetaminophen-caffeine (FIORICET) 50-325-40 MG tablet Take 1 tablet by mouth every 6 (six) hours as needed for up to 5 doses for headache. 5 tablet Wynonia Lawman A, NP      PDMP not reviewed this encounter.   Wynonia Lawman A,  NP 08/22/23 2045

## 2023-08-22 NOTE — Discharge Instructions (Signed)
Please start taking antibiotic as prescribed until finished. You can take Fiorcet as needed for headache unrelieved by Tylenol and Ibuprofen. You can take Zofran as needed for nausea/vomiting. If symptoms persist return here or follow-up with primary care doctor. If symptoms worsen please go to ER.

## 2023-08-22 NOTE — ED Triage Notes (Signed)
Patient here today with c/o headache, nausea, and abd pain X 3 days. She has been taking Tylenol and Excedrin Migraine with no relief. Her children had similar symptoms on Friday but they are okay now.

## 2023-08-27 ENCOUNTER — Telehealth (HOSPITAL_COMMUNITY): Payer: Self-pay | Admitting: Emergency Medicine

## 2023-08-27 MED ORDER — BUTALBITAL-APAP-CAFFEINE 50-325-40 MG PO TABS: 1.0000 | ORAL_TABLET | Freq: Four times a day (QID) | ORAL | 0 refills | Status: AC | PRN

## 2023-08-27 NOTE — Telephone Encounter (Signed)
Patient called UC stating that she was trying to have Fiorcet prescription sent to [facility], but was informed this could not happen. Patient went back to [facility] on [address] and is being told she cannot get prescription filled. Checked PDMP to ensure patient did not have prescription filled already, and she has not. Will resend Architect to [facility] on [address]. Called patient to inform her of new prescription.

## 2023-08-30 ENCOUNTER — Other Ambulatory Visit (HOSPITAL_COMMUNITY): Payer: Self-pay | Admitting: Physician Assistant

## 2023-08-30 DIAGNOSIS — M79605 Pain in left leg: Secondary | ICD-10-CM

## 2023-08-30 DIAGNOSIS — M7989 Other specified soft tissue disorders: Secondary | ICD-10-CM

## 2023-08-31 ENCOUNTER — Ambulatory Visit (HOSPITAL_COMMUNITY)
Admission: RE | Admit: 2023-08-31 | Discharge: 2023-08-31 | Disposition: A | Discharge status: Home / Self Care | Payer: Medicaid Other | Source: Ambulatory Visit | Attending: Cardiovascular Disease | Admitting: Cardiovascular Disease

## 2023-08-31 DIAGNOSIS — M79605 Pain in left leg: Secondary | ICD-10-CM | POA: Diagnosis present

## 2023-08-31 DIAGNOSIS — M7989 Other specified soft tissue disorders: Secondary | ICD-10-CM

## 2023-10-17 ENCOUNTER — Ambulatory Visit: Payer: Medicaid Other | Admitting: Obstetrics and Gynecology

## 2023-10-17 ENCOUNTER — Encounter: Payer: Self-pay | Admitting: Obstetrics and Gynecology

## 2023-10-18 NOTE — Progress Notes (Signed)
Patient did not keep her GYN appointment for 10/17/2023.  Cornelia Copa MD Attending Center for [employer] [employer] [facility])

## 2024-01-11 ENCOUNTER — Other Ambulatory Visit: Payer: Self-pay

## 2024-01-11 ENCOUNTER — Ambulatory Visit: Payer: Medicaid Other | Admitting: Obstetrics & Gynecology

## 2024-01-11 ENCOUNTER — Other Ambulatory Visit (HOSPITAL_COMMUNITY)
Admission: RE | Admit: 2024-01-11 | Discharge: 2024-01-11 | Disposition: A | Discharge status: Home / Self Care | Payer: Medicaid Other | Source: Ambulatory Visit | Attending: Obstetrics & Gynecology | Admitting: Obstetrics & Gynecology

## 2024-01-11 ENCOUNTER — Encounter: Payer: Self-pay | Admitting: Obstetrics & Gynecology

## 2024-01-11 VITALS — Ht 66.0 in | Wt 184.7 lb

## 2024-01-11 DIAGNOSIS — Z30433 Encounter for removal and reinsertion of intrauterine contraceptive device: Secondary | ICD-10-CM | POA: Diagnosis not present

## 2024-01-11 DIAGNOSIS — Z124 Encounter for screening for malignant neoplasm of cervix: Secondary | ICD-10-CM | POA: Diagnosis present

## 2024-01-11 DIAGNOSIS — Z3040 Encounter for surveillance of contraceptives, unspecified: Secondary | ICD-10-CM

## 2024-01-11 MED ORDER — LEVONORGESTREL 20.1 MCG/DAY IU IUD
1.0000 | INTRAUTERINE_SYSTEM | Freq: Once | INTRAUTERINE | Status: AC
  Administered 2024-01-11: 1 via INTRAUTERINE

## 2024-01-11 NOTE — Progress Notes (Signed)
    GYNECOLOGY OFFICE PROCEDURE NOTE  MARGRETE DELUDE is a 38 y.o. [id number] here for Liletta IUD insertion/replacement. No GYN concerns.  Last pap smear was on 2017 and was normal.  IUD Insertion Procedure Note Patient identified, informed consent performed, consent signed.   Discussed risks of irregular bleeding, cramping, infection, malpositioning or misplacement of the IUD outside the uterus which may require further procedure such as laparoscopy. Time out was performed.  Urine pregnancy test negative.  Speculum placed in the vagina.  Cervix visualized.  Cleaned with Betadine x 2.IUD strings grasped and intact device removed  Grasped anteriorly with a single tooth tenaculum.  Uterus sounded to 8 cm.  Liletta IUD placed per manufacturer's recommendations.  Strings trimmed to 3 cm. Tenaculum was removed, good hemostasis noted.  Patient tolerated procedure well.   Patient was given post-procedure instructions.  She was advised to have backup contraception for one week.  Patient was also asked to check IUD strings periodically and follow up in 4 weeks for IUD check.   Scheryl Darter, MD, FACOG Attending Obstetrician & Gynecologist, [facility] for [facility], [facility]

## 2024-01-17 LAB — CYTOLOGY - PAP
Comment: NEGATIVE
Diagnosis: NEGATIVE
Diagnosis: REACTIVE
High risk HPV: NEGATIVE

## 2024-01-26 ENCOUNTER — Encounter: Payer: Self-pay | Admitting: Obstetrics & Gynecology

## 2024-02-14 ENCOUNTER — Ambulatory Visit: Payer: Medicaid Other | Admitting: Certified Nurse Midwife

## 2024-02-15 ENCOUNTER — Ambulatory Visit: Payer: Medicaid Other | Admitting: Obstetrics and Gynecology

## 2024-02-20 ENCOUNTER — Ambulatory Visit: Payer: Medicaid Other | Admitting: Family Medicine

## 2024-02-26 NOTE — Progress Notes (Unsigned)
 History:  [name] is a 38 y.o. [id number] who presents to clinic today for a string check following placement of an IUD 01/11/24. She reports no problems or issues with IUD.    The following portions of the patient's history were reviewed and updated as appropriate: allergies, current medications, family history, past medical history, social history, past surgical history and problem list.  Review of Systems:  Review of Systems  Constitutional: Negative.   Respiratory: Negative.    Genitourinary: Negative.   Skin: Negative.       Objective:  Physical Exam BP 133/77   Pulse 81   Ht 5\' 6"  (1.676 m)   Wt 175 lb 9.6 oz (79.7 kg)   LMP 02/13/2024 (Approximate)   BMI 28.34 kg/m  Physical Exam Exam conducted with a chaperone present.  Constitutional:      Appearance: Normal appearance. She is normal weight.  HENT:     Head: Normocephalic.  Cardiovascular:     Pulses: Normal pulses.  Pulmonary:     Effort: Pulmonary effort is normal.  Genitourinary:    General: Normal vulva.     Exam position: Prone.     Vagina: Normal.     Cervix: Normal.     Comments: Speculum used to visualize cervix. Physiologic discharge noted and strings visualized at appropriate length.  Musculoskeletal:        General: Normal range of motion.  Skin:    General: Skin is warm and dry.  Neurological:     Mental Status: She is alert and oriented to person, place, and time.  Psychiatric:        Mood and Affect: Mood normal.        Behavior: Behavior normal.        Thought Content: Thought content normal.        Judgment: Judgment normal.      Labs and Imaging No results found for this or any previous visit (from the past 24 hours).  No results found.    Assessment & Plan:  1. Encounter for surveillance of contraceptive device (Primary) - Strings observed via speculum exam. Strings appropriate length and no complaints from patient. Routine follow up.    Please refer to After Visit  Summary for other counseling recommendations.   No follow-ups on file.   Richardson Landry, CNM 02/27/2024 8:49 AM   Richardson Landry, CNM 02/27/2024 8:38 AM

## 2024-02-27 ENCOUNTER — Other Ambulatory Visit: Payer: Self-pay

## 2024-02-27 ENCOUNTER — Ambulatory Visit: Admitting: Certified Nurse Midwife

## 2024-02-27 VITALS — BP 133/77 | HR 81 | Ht 66.0 in | Wt 175.6 lb

## 2024-02-27 DIAGNOSIS — Z30431 Encounter for routine checking of intrauterine contraceptive device: Secondary | ICD-10-CM

## 2024-02-27 DIAGNOSIS — Z3040 Encounter for surveillance of contraceptives, unspecified: Secondary | ICD-10-CM

## 2024-04-30 LAB — LAB REPORT - SCANNED
EGFR: 95
Free T4: 2.3 ng/dL
TSH: 0.59 (ref 0.41–5.90)

## 2024-12-11 NOTE — Progress Notes (Signed)
 "  Office Visit Note  Patient: Heidi Willis             Date of Birth: Mar 09, 1986           MRN: [id number]             PCP: Ilah Crigler, MD Referring: Ilah Crigler, MD Visit Date: 12/25/2024 Occupation: Lead Teacher  Subjective:  Pain and swelling in multiple joints  History of Present Illness: Heidi Willis is a 38 y.o. female seen for the evaluation of positive rheumatoid factor and joint pain.  According the patient she had left patellar surgery at age 57 and meniscal tear repair at age 31.  She had another meniscal tear repair on her left knee joint in November 2024.  She states since then she has been getting and frequent cortisone injections.  She has been under care of Dr. Ernie and had cortisone injection to her left knee joint every 3 to 4 months due to swelling.  She states she also had unintentional weight loss of 165 pounds starting 2022 over the next 1 year.  Then the weight stabilized.  She states her labs obtained by her PCP showed hypothyroidism.  She was sent to endocrinologist and the numbers were not as concerning.  She was not given any treatment.  She states in the late 2024 she started experiencing increased joint pain all over which gradually got worse over time.  She states she has been experiencing pain and discomfort in her both elbows, both wrists, both hands, hips, knees, ankles and her feet.  She notices swelling in her elbows wrist hands and her knee joints.  In May 2025 she had labs done by her PCP which showed positive rheumatoid factor and positive anti-CCP antibody.  As her symptoms persist she was placed on methotrexate  3 tablets/week in the late September.  She continued on the same dose but did not notice much improvement.  She states she continues to have a lot of discomfort in her left knee and her right hand. 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  She is single, gravida 2, para 2.  She  had preeclampsia with both pregnancies.  There is no history of DVTs.  She does not drink any alcohol and is non-smoker.  There is history of rheumatoid arthritis in her mother and systemic lupus in her sister.    Activities of Daily Living:  Patient reports morning stiffness for all day. Patient Reports nocturnal pain.  Difficulty dressing/grooming: Reports Difficulty climbing stairs: Reports Difficulty getting out of chair: Reports Difficulty using hands for taps, buttons, cutlery, and/or writing: Reports  Review of Systems  Constitutional:  Positive for fatigue.  HENT:  Positive for mouth dryness. Negative for mouth sores.   Eyes:  Positive for dryness.  Respiratory:  Negative for shortness of breath.   Cardiovascular:  Negative for chest pain and palpitations.  Gastrointestinal:  Negative for blood in stool, constipation and diarrhea.  Endocrine: Negative for increased urination.  Genitourinary:  Negative for involuntary urination.  Musculoskeletal:  Positive for joint pain, gait problem, joint pain, joint swelling, myalgias, muscle weakness, morning stiffness, muscle tenderness and myalgias.  Skin:  Negative for color change, rash, hair loss and sensitivity to sunlight.  Allergic/Immunologic: Negative for susceptible to infections.  Neurological:  Negative for dizziness and headaches.  Hematological:  Negative for swollen glands.  Psychiatric/Behavioral:  Positive for sleep  disturbance. Negative for depressed mood. The patient is not nervous/anxious.     PMFS History:  Patient Active Problem List   Diagnosis Date Noted   Primary hypertension 12/16/2024   Other insomnia 12/16/2024   Anxiety 12/16/2024    Past Medical History:  Diagnosis Date   Abnormal uterine bleeding (AUB) 06/10/2016   BMI 50.0-59.9, adult (HCC)    Bronchitis    Hx   Headache(784.0)    otc meds prn   Hypertension    with 2015 pregnancy    Family History  Problem Relation Age of Onset   Heart  failure Mother    Healthy Father    Lupus Sister    GER disease Son    Asthma Daughter    Allergies Daughter    Past Surgical History:  Procedure Laterality Date   CESAREAN SECTION WITH BILATERAL TUBAL LIGATION Bilateral 01/15/2014   Procedure: CESAREAN SECTION WITH BILATERAL TUBAL LIGATION;  Surgeon: Truman Corona, MD;  Location: [facility];  Service: Obstetrics;  Laterality: Bilateral;  PRIMARY EDC 2/15   DILATATION & CURETTAGE/HYSTEROSCOPY WITH MYOSURE N/A 07/01/2016   Procedure: HYSTEROSCOPY WITH MYOSURE;  Surgeon: Bebe Furry, MD;  Location: [facility];  Service: Gynecology;  Laterality: N/A;   ELBOW SURGERY     right   KNEE ARTHROSCOPY Left    3 surgeries, 2015, 2016, 2024   POLYPECTOMY N/A 07/01/2016   Procedure: POLYPECTOMY;  Surgeon: Bebe Furry, MD;  Location: [facility];  Service: Gynecology;  Laterality: N/A;   TONSILLECTOMY     WISDOM TOOTH EXTRACTION  09/28/2016   Social History[1] Social History   Social History Narrative   Not on file     There is no immunization history for the selected administration types on file for this patient.   Objective: Vital Signs: BP 129/88   Pulse (!) 58   Resp 15   Ht 5' 6 (1.676 m)   Wt 180 lb 3.2 oz (81.7 kg)   BMI 29.09 kg/m    Physical Exam Vitals and nursing note reviewed.  Constitutional:      Appearance: She is well-developed.  HENT:     Head: Normocephalic and atraumatic.  Eyes:     Conjunctiva/sclera: Conjunctivae normal.  Cardiovascular:     Rate and Rhythm: Normal rate and regular rhythm.     Heart sounds: Normal heart sounds.  Pulmonary:     Effort: Pulmonary effort is normal.     Breath sounds: Normal breath sounds.  Abdominal:     General: Bowel sounds are normal.     Palpations: Abdomen is soft.  Musculoskeletal:     Cervical back: Normal range of motion.  Lymphadenopathy:     Cervical: No cervical adenopathy.  Skin:    General: Skin is warm and dry.     Capillary Refill: Capillary refill  takes less than 2 seconds.  Neurological:     Mental Status: She is alert and oriented to person, place, and time.  Psychiatric:        Behavior: Behavior normal.      Musculoskeletal Exam: Cervical, thoracic and lumbar spine were in good range of motion.  Shoulders with good range of motion with discomfort on range of motion of her right shoulder joint.  Right elbow joint contracture with synovitis was noted.  She had tenderness over her right wrist joint and some swelling over the right ulnar styloid.  Several of the MCPs and PIPs were inflamed as described below.  She good range of motion of bilateral  hip joints.  She had swelling and warmth in the bilateral knee joints and her ankles.  There was no tenderness across MTPs.  CDAI Exam: CDAI Score: 30  Patient Global: 50 / 100; Provider Global: 60 / 100 Swollen: 11 ; Tender: 12  Joint Exam 12/25/2024      Right  Left  Glenohumeral   Tender     Elbow  Swollen Tender     Wrist  Swollen Tender     MCP 1  Swollen Tender     MCP 2  Swollen Tender     MCP 3  Swollen Tender     MCP 4     Swollen Tender  PIP 2 (finger)  Swollen Tender     Knee  Swollen Tender  Swollen Tender  Ankle  Swollen Tender  Swollen Tender     Investigation: No additional findings.  Imaging: No results found.  Recent Labs: Lab Results  Component Value Date   WBC 5.7 06/24/2016   HGB 11.9 (L) 06/24/2016   PLT 295 06/24/2016   NA 135 06/24/2016   K 3.7 06/24/2016   CL 103 06/24/2016   CO2 23 06/24/2016   GLUCOSE 91 06/24/2016   BUN 17 06/24/2016   CREATININE 0.82 06/24/2016   BILITOT 0.4 06/24/2016   ALKPHOS 105 06/24/2016   AST 13 (L) 06/24/2016   ALT 14 06/24/2016   PROT 7.4 06/24/2016   ALBUMIN 4.0 06/24/2016   CALCIUM 9.5 06/24/2016   GFRAA >60 06/24/2016   Apr 29, 2024 RF 54, anti-CCP> 250, LDL 73, TSH normal, CMP creatinine 0.81, AST 9, ALT 6, CBC WBC 3.7, hemoglobin 12.4, platelets 287 sed rate 17, CRP<3.0, vitamin D 34  Speciality  Comments: No specialty comments available.  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  She states she had to quit working.  She has difficulty doing routine activities even opening jars and bottles.  She has difficulty walking and climbing stairs.  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  She has been staying home and getting help from her children.  She was placed on methotrexate  3 tablets p.o. weekly  by her PCP for rheumatoid arthritis.  She has not noticed much improvement so far.  She discomfort in multiple joints and synovitis in multiple joints as described above.  Different treatment options were discussed at length.  I will switch her to subcu methotrexate  which will be more effective.  Patient was in agreement.  Side effects of methotrexate  were discussed at length.  A handout was given and consent was taken.  Will start her on methotrexate  0.6 mL subcu and then increase it to 0.8 mL subcu weekly.  Labs will be drawn today.  Will inform her if there are any changes in the labs.  Will send prescription for folic acid  2 mg p.o. daily.  Side effects including the risk of hepatic toxicity, renal toxicity, hematological toxicity and pulmonary hypersensitivity reaction was discussed.  Patient voiced understanding.  She is ready to switch to subcu methotrexate .  I will also give her prednisone  taper as she has severe disease.  She will be starting on prednisone  20 mg p.o. every morning and taper by 5 mg every 4 days.  Plan: Sedimentation rate, predniSONE  (DELTASONE ) 5  MG tablet  Drug Counseling TB Gold: Ending Hepatitis panel: Pending  Chest-xray: June 24, 2016  Contraception: Oral contraceptive pill  Alcohol use:  None  Patient was counseled on the purpose, proper use, and adverse effects of methotrexate  including nausea, infection, and signs and symptoms of pneumonitis.  Reviewed instructions with patient to take methotrexate  weekly along with folic acid  daily.  Discussed the importance of frequent monitoring of kidney and liver function and blood counts, and provided patient with standing lab instructions.  Counseled patient to avoid NSAIDs and alcohol while on methotrexate .  Provided patient with educational materials on methotrexate  and answered all questions.  Advised patient to get annual influenza vaccine and to get a pneumococcal vaccine if patient has not already had one.  Patient voiced understanding.  Patient consented to methotrexate  use.  Will upload into chart.      High risk medication use -patient is currently taking methotrexate  3 tablets p.o. weekly.  I advised her to discontinue oral methotrexate  once she starts subcutaneous methotrexate .  She will be on subcu methotrexate  and folic acid  combination.  Plan: CBC with Differential/Platelet, Comprehensive metabolic panel with GFR, Hepatitis B core antibody, IgM, Hepatitis B surface antigen, Hepatitis C antibody, QuantiFERON-TB Gold Plus, Serum protein electrophoresis with reflex, IgG, IgA, IgM, Thiopurine methyltransferase(tpmt)rbc  Polyarthralgia-she has pain and discomfort in multiple joints today.  Pain in both hands -she has synovitis in multiple joints as described above.  Plan: XR Hand 2 View Right, XR Hand 2 View Left.  X-rays were unremarkable.  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  Chronic pain of both knees -she had warmth and swelling in both knees.  Plan: XR KNEE 3 VIEW RIGHT, XR KNEE 3 VIEW LEFT bilateral severe lateral compartment narrowing and mild patellofemoral narrowing was noted.  Pain in both feet-she complains of discomfort in her feet and ankles.  Her ankles were warm to touch.  There  was no tenderness across MTPs.Plan: XR Foot 2 Views Right, XR Foot 2 Views Left.  X-rays history of osteoarthritis.  Pre-eclampsia in third trimester -patient had preeclampsia with both pregnancies.  Her sister has lupus.  I will obtain following labs today.  Plan: Protein / creatinine ratio, urine,  Sedimentation rate, CK, ANA, RNP Antibody, Anti-Smith antibody, Sjogrens syndrome-A extractable nuclear antibody, Sjogrens syndrome-B extractable nuclear antibody, Anti-DNA antibody, double-stranded, C3 and C4, Beta-2  glycoprotein antibodies, Cardiolipin antibodies, IgG, IgM, IgA, Lupus Anticoagulant Eval w/Reflex, Anti-scleroderma antibody  Primary hypertension -blood pressure was elevated at 137/95.  Repeat blood pressure was 142/87.  She was advised to monitor blood pressure closely.  Other insomnia-she reports insomnia degree nocturnal pain.  Anxiety  Family history of rheumatoid arthritis-mother  Family history of systemic lupus erythematosus-Sister  Other fatigue - Plan: CK  Orders: Orders Placed This Encounter  Procedures   XR Hand 2 View Right   XR Hand 2 View Left   XR KNEE 3 VIEW RIGHT   XR KNEE 3 VIEW LEFT   XR Foot 2 Views Right   XR Foot 2 Views Left   Protein / creatinine ratio, urine   CBC with Differential/Platelet   Comprehensive metabolic panel with GFR   Sedimentation rate   CK   ANA   RNP Antibody   Anti-Smith antibody   Sjogrens syndrome-A extractable nuclear antibody   Sjogrens syndrome-B extractable nuclear antibody   Anti-DNA antibody, double-stranded   C3 and C4   Beta-2  glycoprotein antibodies   Cardiolipin antibodies, IgG, IgM, IgA   Lupus  Anticoagulant Eval w/Reflex   Anti-scleroderma antibody   Hepatitis B core antibody, IgM   Hepatitis B surface antigen   Hepatitis C antibody   QuantiFERON-TB Gold Plus   Serum protein electrophoresis with reflex   IgG, IgA, IgM   Thiopurine methyltransferase(tpmt)rbc   Meds ordered this encounter   Medications   predniSONE  (DELTASONE ) 5 MG tablet    Sig: Take 4 tabs po qd x 4 days, 3  tabs po qd x 4 days, 2  tabs po qd x 4 days, 1  tab po qd x 4 days    Dispense:  40 tablet    Refill:  0   folic acid  (FOLVITE ) 1 MG tablet    Sig: Take 2 tablets (2 mg total) by mouth daily.    Dispense:  180 tablet    Refill:  3   methotrexate  50 MG/2ML injection    Sig: Inject 0.6mL into the skin once weekly for 2 weeks, if labs are normal then increase 0.90mL into the skin once weekly    Dispense:  4 mL    Refill:  0   Tuberculin-Allergy  Syringes 27G X 1/2 1 ML MISC    Sig: Use 1 syringe once weekly to inject methotrexate .    Dispense:  12 each    Refill:  3    Face-to-face time spent with patient was over 70 minutes. Greater than 50% of time was spent in counseling and coordination of care.  Follow-Up Instructions: Return for Rheumatoid arthritis.   Maya Nash, MD  Note - This record has been created using Animal nutritionist.  Chart creation errors have been sought, but may not always  have been located. Such creation errors do not reflect on  the standard of medical care.     [1]  Social History Tobacco Use   Smoking status: Never    Passive exposure: Current   Smokeless tobacco: Never  Vaping Use   Vaping status: Never Used  Substance Use Topics   Alcohol use: No   Drug use: No   "

## 2024-12-16 DIAGNOSIS — G4709 Other insomnia: Secondary | ICD-10-CM | POA: Insufficient documentation

## 2024-12-16 DIAGNOSIS — F419 Anxiety disorder, unspecified: Secondary | ICD-10-CM | POA: Insufficient documentation

## 2024-12-16 DIAGNOSIS — I10 Essential (primary) hypertension: Secondary | ICD-10-CM | POA: Insufficient documentation

## 2024-12-25 ENCOUNTER — Encounter: Payer: Self-pay | Admitting: Rheumatology

## 2024-12-25 ENCOUNTER — Ambulatory Visit

## 2024-12-25 ENCOUNTER — Ambulatory Visit: Attending: Rheumatology | Admitting: Rheumatology

## 2024-12-25 VITALS — BP 129/88 | HR 58 | Resp 15 | Ht 66.0 in | Wt 180.2 lb

## 2024-12-25 DIAGNOSIS — M24521 Contracture, right elbow: Secondary | ICD-10-CM | POA: Insufficient documentation

## 2024-12-25 DIAGNOSIS — Z79899 Other long term (current) drug therapy: Secondary | ICD-10-CM | POA: Diagnosis present

## 2024-12-25 DIAGNOSIS — Z8269 Family history of other diseases of the musculoskeletal system and connective tissue: Secondary | ICD-10-CM | POA: Diagnosis present

## 2024-12-25 DIAGNOSIS — M79642 Pain in left hand: Secondary | ICD-10-CM | POA: Diagnosis not present

## 2024-12-25 DIAGNOSIS — M79671 Pain in right foot: Secondary | ICD-10-CM | POA: Diagnosis not present

## 2024-12-25 DIAGNOSIS — Z8261 Family history of arthritis: Secondary | ICD-10-CM | POA: Insufficient documentation

## 2024-12-25 DIAGNOSIS — R5383 Other fatigue: Secondary | ICD-10-CM | POA: Diagnosis not present

## 2024-12-25 DIAGNOSIS — M25561 Pain in right knee: Secondary | ICD-10-CM

## 2024-12-25 DIAGNOSIS — F419 Anxiety disorder, unspecified: Secondary | ICD-10-CM | POA: Diagnosis not present

## 2024-12-25 DIAGNOSIS — M25562 Pain in left knee: Secondary | ICD-10-CM | POA: Insufficient documentation

## 2024-12-25 DIAGNOSIS — G4709 Other insomnia: Secondary | ICD-10-CM | POA: Insufficient documentation

## 2024-12-25 DIAGNOSIS — M79641 Pain in right hand: Secondary | ICD-10-CM | POA: Diagnosis not present

## 2024-12-25 DIAGNOSIS — O1493 Unspecified pre-eclampsia, third trimester: Secondary | ICD-10-CM | POA: Insufficient documentation

## 2024-12-25 DIAGNOSIS — R7689 Other specified abnormal immunological findings in serum: Secondary | ICD-10-CM

## 2024-12-25 DIAGNOSIS — G8929 Other chronic pain: Secondary | ICD-10-CM | POA: Diagnosis not present

## 2024-12-25 DIAGNOSIS — M79672 Pain in left foot: Secondary | ICD-10-CM

## 2024-12-25 DIAGNOSIS — I10 Essential (primary) hypertension: Secondary | ICD-10-CM | POA: Diagnosis present

## 2024-12-25 DIAGNOSIS — M2550 Pain in unspecified joint: Secondary | ICD-10-CM | POA: Insufficient documentation

## 2024-12-25 DIAGNOSIS — M0579 Rheumatoid arthritis with rheumatoid factor of multiple sites without organ or systems involvement: Secondary | ICD-10-CM | POA: Insufficient documentation

## 2024-12-25 MED ORDER — PREDNISONE 5 MG PO TABS: ORAL_TABLET | ORAL | 0 refills | Status: AC

## 2024-12-25 MED ORDER — "TUBERCULIN-ALLERGY SYRINGES 27G X 1/2"" 1 ML MISC": 3 refills | Status: AC

## 2024-12-25 MED ORDER — METHOTREXATE SODIUM CHEMO INJECTION 50 MG/2ML: INTRAMUSCULAR | 0 refills | Status: AC

## 2024-12-25 MED ORDER — FOLIC ACID 1 MG PO TABS: 2.0000 mg | ORAL_TABLET | Freq: Every day | ORAL | 3 refills | Status: AC

## 2024-12-25 NOTE — Patient Instructions (Signed)
 Standing Labs We placed an order today for your standing lab work.   Please have your standing labs drawn in 2 weeks x2 and then every 3 months.   Please have your labs drawn 2 weeks prior to your appointment so that the provider can discuss your lab results at your appointment, if possible.  Please note that you may see your imaging and lab results in MyChart before we have reviewed them. We will contact you once all results are reviewed. Please allow our office up to 72 hours to thoroughly review all of the results before contacting the office for clarification of your results.  WALK-IN LAB HOURS  Monday through Thursday from 8:00 am - 4:30 pm and Friday from 8:00 am-12:00 pm.  Patients with office visits requiring labs will be seen before walk-in labs.  You may encounter longer than normal wait times. Please allow additional time. Wait times may be shorter on  Monday and Thursday afternoons.  We do not book appointments for walk-in labs. We appreciate your patience and understanding with our staff.   Labs are drawn by [facility]. Please bring your co-pay at the time of your lab draw.  You may receive a bill from [facility] for your lab work.  Please note if you are on Hydroxychloroquine and and an order has been placed for a Hydroxychloroquine level,  you will need to have it drawn 4 hours or more after your last dose.  If you wish to have your labs drawn at another location, please call the office 24 hours in advance so we can fax the orders.  The office is located at [address], [place], [place] [address]   If you have any questions regarding directions or hours of operation,  please call [phone].   As a reminder, please drink plenty of water prior to coming for your lab work. Thanks!   If you have signs or symptoms of an infection or start antibiotics: First, call your PCP for workup of your infection. Hold your medication through the infection, until you complete  your antibiotics, and until symptoms resolve if you take the following: Injectable medication (Actemra, Benlysta, Cimzia, Cosentyx, Enbrel, Humira, Kevzara, Orencia, Remicade, Simponi, Stelara, Taltz, Tremfya) Methotrexate  Leflunomide (Arava) Mycophenolate (Cellcept) Xeljanz, Olumiant, or Rinvoq   Vaccines You are taking a medication(s) that can suppress your immune system.  The following immunizations are recommended: Flu annually Covid-19  HPV Td/Tdap (tetanus, diphtheria, pertussis) every 10 years Pneumonia (Prevnar 15 then Pneumovax 23 at least 1 year apart.  Alternatively, can take Prevnar 20 without needing additional dose) Shingrix: 2 doses from 4 weeks to 6 months apart  Please check with your PCP to make sure you are up to date. Methotrexate  Injection What is this medication? METHOTREXATE  (METH oh TREX ate) treats inflammatory conditions such as arthritis and psoriasis. It works by decreasing inflammation, which can reduce pain and prevent long-term injury to the joints and skin. It may also be used to treat some types of cancer. It works by slowing down the growth of cancer cells. This medicine may be used for other purposes; ask your health care provider or pharmacist if you have questions. What should I tell my care team before I take this medication? They need to know if you have any of these conditions: Fluid in the stomach area or lungs Frequently drink alcohol Infection or immune system problems Kidney disease Liver disease Low blood counts (white cells, platelets, or red blood cells) Lung disease Recent  or ongoing radiation Recent or upcoming vaccine Stomach ulcers Ulcerative colitis An unusual or allergic reaction to methotrexate , other medications, foods, dyes, or preservatives Pregnant or trying to get pregnant Breastfeeding How should I use this medication? This medication is for infusion into a vein or for injection into muscle or into the spinal fluid  (whichever applies). It is usually given in a hospital or clinic setting. In rare cases, you might get this medication at home. You will be taught how to give this medication. Use exactly as directed. Take your medication at regular intervals. Do not take your medication more often than directed. If this medication is used for arthritis or psoriasis, it should be taken weekly, NOT daily. It is important that you put your used needles and syringes in a special sharps container. Do not put them in a trash can. If you do not have a sharps container, call your pharmacist or care team to get one. Talk to your care team about the use of this medication in children. While this medication may be prescribed for children as young as 2 years for selected conditions, precautions do apply. Overdosage: If you think you have taken too much of this medicine contact a poison control center or emergency room at once. NOTE: This medicine is only for you. Do not share this medicine with others. What if I miss a dose? It is important not to miss your dose. Call your care team if you are unable to keep an appointment. If you give yourself the medication, and you miss a dose, talk with your care team. Do not take double or extra doses. What may interact with this medication? Do not take this medication with any of the following: Acitretin Probenecid This medication may also interact with the following: Aspirin or aspirin-like medications Azathioprine Certain antibiotics, such as gentamicin, penicillin, tetracycline, vancomycin Certain medications that treat or prevent blood clots, such as warfarin, apixaban, dabigatran, rivaroxaban Certain medications for stomach problems, such as esomeprazole, omeprazole, pantoprazole Dapsone Hydroxychloroquine Live virus vaccines Medications for viral infections, such as acyclovir, cidofovir, foscarnet, ganciclovir Mercaptopurine NSAIDs, medications for pain and inflammation,  such as ibuprofen  or naproxen  Phenytoin Pyrimethamine Retinoids, such as isotretinoin or tretinoin Sulfonamides, such as sulfasalazine or trimethoprim ; sulfamethoxazole Theophylline This list may not describe all possible interactions. Give your health care provider a list of all the medicines, herbs, non-prescription drugs, or dietary supplements you use. Also tell them if you smoke, drink alcohol, or use illegal drugs. Some items may interact with your medicine. What should I watch for while using this medication? This medication may make you feel generally unwell. This is not uncommon as chemotherapy can affect healthy cells as well as cancer cells. Report any side effects. Continue your course of treatment even though you feel ill unless your care team tells you to stop. Your condition will be monitored carefully while you are receiving this medication. Avoid alcoholic drinks. This medication can cause serious side effects. To reduce the risk, your care team may give you other medications to take before receiving this one. Be sure to follow the directions from your care team. This medication can make you more sensitive to the sun. Keep out of the sun. If you cannot avoid being in the sun, wear protective clothing and use sunscreen. Do not use sun lamps or tanning beds/booths. You may get drowsy or dizzy. Do not drive, use machinery, or do anything that needs mental alertness until you know how this medication affects you.  Do not stand or sit up quickly, especially if you are an older patient. This reduces the risk of dizzy or fainting spells. You may need blood work while you are taking this medication. Call your care team for advice if you get a fever, chills or sore throat, or other symptoms of a cold or flu. Do not treat yourself. This medication decreases your body's ability to fight infections. Try to avoid being around people who are sick. This medication may increase your risk to bruise or  bleed. Call your care team if you notice any unusual bleeding. Be careful brushing or flossing your teeth or using a toothpick because you may get an infection or bleed more easily. If you have any dental work done, tell your dentist you are receiving this medication Check with your care team if you get an attack of severe diarrhea, nausea and vomiting, or if you sweat a lot. The loss of too much body fluid can make it dangerous for you to take this medication. Talk to your care team about your risk of cancer. You may be more at risk for certain types of cancers if you take this medication. Do not become pregnant while taking this medication or for 6 months after stopping it. Women should inform their care team if they wish to become pregnant or think they might be pregnant. Men should not father a child while taking this medication and for 3 months after stopping it. There is potential for serious harm to an unborn child. Talk to your care team for more information. Do not breast-feed an infant while taking this medication or for 1 week after stopping it. This medication may make it more difficult to get pregnant or father a child. Talk to your care team if you are concerned about your fertility. What side effects may I notice from receiving this medication? Side effects that you should report to your care team as soon as possible: Allergic reactions--skin rash, itching, hives, swelling of the face, lips, tongue, or throat Blood clot--pain, swelling, or warmth in the leg, shortness of breath, chest pain Dry cough, shortness of breath or trouble breathing Infection--fever, chills, cough, sore throat, wounds that don't heal, pain or trouble when passing urine, general feeling of discomfort or being unwell Kidney injury--decrease in the amount of urine, swelling of the ankles, hands, or feet Liver injury--right upper belly pain, loss of appetite, nausea, light-colored stool, dark yellow or brown urine,  yellowing of the skin or eyes, unusual weakness or fatigue Low red blood cell count--unusual weakness or fatigue, dizziness, headache, trouble breathing Redness, blistering, peeling, or loosening of the skin, including inside the mouth Seizures Unusual bruising or bleeding Side effects that usually do not require medical attention (report to your care team if they continue or are bothersome): Diarrhea Dizziness Hair loss Nausea Pain, redness, or swelling with sores inside the mouth or throat Vomiting This list may not describe all possible side effects. Call your doctor for medical advice about side effects. You may report side effects to FDA at 1-800-FDA-1088. Where should I keep my medication? This medication is given in a hospital or clinic. It will not be stored at home. NOTE: This sheet is a summary. It may not cover all possible information. If you have questions about this medicine, talk to your doctor, pharmacist, or health care provider.  2024 Elsevier/Gold Standard (2023-05-03 00:00:00)Rheumatoid Arthritis Rheumatoid arthritis (RA) is a long-term (chronic) disease. RA causes inflammation in your joints. Your joints may  feel painful, stiff, swollen, and warm. RA may start slowly. It most often affects the small joints of the hands and feet. It can also affect other parts of the body. Symptoms of RA often come and go. There is no cure for RA, but medicines can help your symptoms. What are the causes? RA is an autoimmune disease. This means that your body's defense system (immune system) attacks healthy parts of your body by mistake. The exact cause of RA is not known. What increases the risk? Being female. Having a family history of RA or other diseases like RA. Smoking. Being very overweight (obese). Being exposed to pollutants or chemicals. What are the signs or symptoms? Symptoms start slowly. They are often worse in the morning. The first symptom is often morning stiffness  that lasts longer than 30 minutes. As RA gets worse, symptoms may include: Pain, stiffness, swelling, warmth, and tenderness in joints on both sides of your body. Loss of energy. Not wanting to eat as much as normal. Weight loss. A low fever. Dry eyes and a dry mouth. Firm lumps that grow under your skin. Changes in the way your joints look or the way they work. Symptoms vary and they often come and go. Symptoms sometimes get worse for a period of time. These are called flares. How is this treated? Treatment may include: Taking good care of yourself. Be sure to rest as needed, eat a healthy diet, and exercise. Medicines. These may include: Pain relievers. Medicines to help with inflammation. Disease-modifying antirheumatic drugs (DMARDs). Medicines called biologic response modifiers. Physical therapy and occupational therapy. Surgery, if joint damage is very bad. Your doctor will work with you to find the best treatments. Follow these instructions at home: Managing pain, stiffness, and swelling If told, put heat on the affected area. Do this as often as told by your doctor. Use the heat source that your doctor recommends, such as a moist heat pack or a heating pad. Place a towel between your skin and the heat source. Leave the heat on for 20-30 minutes. Take off the heat if your skin turns bright red. This is very important. If you cannot feel pain, heat, or cold, you have a greater risk of getting burned.  Activity Return to your normal activities when your doctor says that it is safe. Rest when you have a flare. Exercise as told by your doctor. This can help your joints move better and get stronger. General instructions Take over-the-counter and prescription medicines only as told by your doctor. Keep all follow-up visits. Where to find more information [employer] Rheumat[employer]: rheumatology.org Arthritis Foundation: arthritis.org Contact a doctor if: You have a  flare. You have a fever. You have problems because of your medicines. Get help right away if: You have chest pain. You have trouble breathing. You get a hot, painful joint all of a sudden, and it is worse than your normal joint aches. These symptoms may be an emergency. Get help right away. Call 911. Do not wait to see if the symptoms will go away. Do not drive yourself to the hospital. Summary RA is a long-term disease. RA causes inflammation in your joints. Symptoms of RA start slowly. They are often worse in the morning. This information is not intended to replace advice given to you by your health care provider. Make sure you discuss any questions you have with your health care provider. Document Revised: 09/30/2021 Document Reviewed: 09/30/2021 Elsevier Patient Education  2024 [employer].

## 2024-12-26 ENCOUNTER — Ambulatory Visit: Payer: Self-pay | Admitting: Rheumatology

## 2024-12-26 NOTE — Progress Notes (Signed)
 Potassium is low.  Patient should discuss low potassium with her PCP.  Please forward results to her PCP.

## 2024-12-29 NOTE — Progress Notes (Signed)
 CBC normal, CMP shows low potassium, urine protein creatinine ratio normal, sed rate mildly elevated, immunoglobulins A and M mildly elevated and nonspecific, CK normal, ENA panel negative, complements normal anticardiolipin and beta-2  GP 1 negative, hepatitis B and C nonreactive, TB Gold negative.  IFE, TPMT, ANA and lupus anticoagulant pending.  I will discuss results at the follow-up visit.

## 2025-01-01 LAB — CBC WITH DIFFERENTIAL/PLATELET
Absolute Lymphocytes: 1058 {cells}/uL (ref 850–3900)
Absolute Monocytes: 482 {cells}/uL (ref 200–950)
Basophils Absolute: 39 {cells}/uL (ref 0–200)
Basophils Relative: 0.7 %
Eosinophils Absolute: 62 {cells}/uL (ref 15–500)
Eosinophils Relative: 1.1 %
HCT: 42.4 % (ref 35.9–46.0)
Hemoglobin: 14.3 g/dL (ref 11.7–15.5)
MCH: 31.8 pg (ref 27.0–33.0)
MCHC: 33.7 g/dL (ref 31.6–35.4)
MCV: 94.2 fL (ref 81.4–101.7)
MPV: 9.7 fL (ref 7.5–12.5)
Monocytes Relative: 8.6 %
Neutro Abs: 3959 {cells}/uL (ref 1500–7800)
Neutrophils Relative %: 70.7 %
Platelets: 298 Thousand/uL (ref 140–400)
RBC: 4.5 Million/uL (ref 3.80–5.10)
RDW: 12.6 % (ref 11.0–15.0)
Total Lymphocyte: 18.9 %
WBC: 5.6 Thousand/uL (ref 3.8–10.8)

## 2025-01-01 LAB — LUPUS ANTICOAGULANT EVAL W/ REFLEX
PTT-LA Screen: 41 s — ABNORMAL HIGH
dRVVT: 32 s

## 2025-01-01 LAB — QUANTIFERON-TB GOLD PLUS
Mitogen-NIL: 5.76 [IU]/mL
NIL: 0.02 [IU]/mL
QuantiFERON-TB Gold Plus: NEGATIVE
TB1-NIL: <0 [IU]/mL
TB2-NIL: 0 [IU]/mL

## 2025-01-01 LAB — CARDIOLIPIN ANTIBODIES, IGG, IGM, IGA
Anticardiolipin IgA: 3.1 [APL'U]/mL
Anticardiolipin IgG: <2 [GPL'U]/mL
Anticardiolipin IgM: <2 [MPL'U]/mL

## 2025-01-01 LAB — PROTEIN ELECTROPHORESIS, SERUM, WITH REFLEX
Albumin ELP: 4.2 g/dL (ref 3.8–4.8)
Alpha 1: 0.3 g/dL (ref 0.2–0.3)
Alpha 2: 0.8 g/dL (ref 0.5–0.9)
Beta 2: 0.6 g/dL — ABNORMAL HIGH (ref 0.2–0.5)
Beta Globulin: 0.5 g/dL (ref 0.4–0.6)
Gamma Globulin: 1.3 g/dL (ref 0.8–1.7)
Total Protein: 7.7 g/dL (ref 6.1–8.1)

## 2025-01-01 LAB — ANTI-DNA ANTIBODY, DOUBLE-STRANDED: ds DNA Ab: <1 [IU]/mL

## 2025-01-01 LAB — COMPREHENSIVE METABOLIC PANEL WITH GFR
AG Ratio: 1.5 (calc) (ref 1.0–2.5)
ALT: 8 U/L (ref 6–29)
AST: 9 U/L — ABNORMAL LOW (ref 10–30)
Albumin: 4.4 g/dL (ref 3.6–5.1)
Alkaline phosphatase (APISO): 98 U/L (ref 31–125)
BUN: 13 mg/dL (ref 7–25)
CO2: 27 mmol/L (ref 20–32)
Calcium: 9.8 mg/dL (ref 8.6–10.2)
Chloride: 101 mmol/L (ref 98–110)
Creat: 0.71 mg/dL (ref 0.50–0.97)
Globulin: 3 g/dL (ref 1.9–3.7)
Glucose, Bld: 73 mg/dL (ref 65–99)
Potassium: 3.4 mmol/L — ABNORMAL LOW (ref 3.5–5.3)
Sodium: 138 mmol/L (ref 135–146)
Total Bilirubin: 0.5 mg/dL (ref 0.2–1.2)
Total Protein: 7.4 g/dL (ref 6.1–8.1)
eGFR: 111 mL/min/1.73m2

## 2025-01-01 LAB — IFE INTERPRETATION

## 2025-01-01 LAB — BETA-2 GLYCOPROTEIN ANTIBODIES
Beta-2 Glyco 1 IgA: 2.2 U/mL
Beta-2 Glyco 1 IgM: <2 U/mL
Beta-2 Glyco I IgG: <2 U/mL

## 2025-01-01 LAB — PROTEIN / CREATININE RATIO, URINE
Creatinine, Urine: 53 mg/dL (ref 20–275)
Protein/Creat Ratio: 151 mg/g{creat} (ref 24–184)
Protein/Creatinine Ratio: 0.151 mg/mg{creat} (ref 0.024–0.184)
Total Protein, Urine: 8 mg/dL (ref 5–24)

## 2025-01-01 LAB — ANA: Anti Nuclear Antibody (ANA): POSITIVE — AB

## 2025-01-01 LAB — ANTI-NUCLEAR AB-TITER (ANA TITER): ANA Titer 1: 1:640 {titer} — ABNORMAL HIGH

## 2025-01-01 LAB — RFLX HEXAGONAL PHASE CONFIRM: Hexagonal Phase Conf: NEGATIVE

## 2025-01-01 LAB — SJOGRENS SYNDROME-A EXTRACTABLE NUCLEAR ANTIBODY: SSA (Ro) (ENA) Antibody, IgG: <1 AI

## 2025-01-01 LAB — IGG, IGA, IGM
IgG (Immunoglobin G), Serum: 1355 mg/dL (ref 600–1640)
IgM, Serum: 307 mg/dL — ABNORMAL HIGH (ref 50–300)
Immunoglobulin A: 482 mg/dL — ABNORMAL HIGH (ref 47–310)

## 2025-01-01 LAB — SJOGRENS SYNDROME-B EXTRACTABLE NUCLEAR ANTIBODY: SSB (La) (ENA) Antibody, IgG: <1 AI

## 2025-01-01 LAB — HEPATITIS C ANTIBODY: Hepatitis C Ab: NONREACTIVE

## 2025-01-01 LAB — CK: Total CK: 38 U/L (ref 20–239)

## 2025-01-01 LAB — C3 AND C4
C3 Complement: 131 mg/dL (ref 83–193)
C4 Complement: 24 mg/dL (ref 15–57)

## 2025-01-01 LAB — RNP ANTIBODY: Ribonucleic Protein(ENA) Antibody, IgG: <1 AI

## 2025-01-01 LAB — SEDIMENTATION RATE: Sed Rate: 22 mm/h — ABNORMAL HIGH (ref 0–20)

## 2025-01-01 LAB — HEPATITIS B CORE ANTIBODY, IGM: Hep B C IgM: NONREACTIVE

## 2025-01-01 LAB — HEPATITIS B SURFACE ANTIGEN: Hepatitis B Surface Ag: NONREACTIVE

## 2025-01-01 LAB — ANTI-SMITH ANTIBODY: ENA SM Ab Ser-aCnc: <1 AI

## 2025-01-01 LAB — ANTI-SCLERODERMA ANTIBODY: Scleroderma (Scl-70) (ENA) Antibody, IgG: <1 AI

## 2025-01-01 LAB — THIOPURINE METHYLTRANSFERASE (TPMT), RBC: Thiopurine Methyltransferase, RBC: 18 nmol/h/mL

## 2025-01-01 NOTE — Progress Notes (Signed)
 Lupus anticoagulatnt negative, ANA positive, TPMT normal, IFE normal.

## 2025-01-07 NOTE — Progress Notes (Unsigned)
 "  Office Visit Note  Patient: Heidi Willis             Date of Birth: Sep 09, 1986           MRN: [id number]             PCP: Ilah Crigler, MD Referring: Ilah Crigler, MD Visit Date: 01/21/2025 Occupation: Lead Teacher  Subjective:  No chief complaint on file.   History of Present Illness: Heidi Willis is a 39 y.o. female ***     Activities of Daily Living:  Patient reports morning stiffness for *** {minute/hour:19697}.   Patient {ACTIONS;DENIES/REPORTS:21021675::Denies} nocturnal pain.  Difficulty dressing/grooming: {ACTIONS;DENIES/REPORTS:21021675::Denies} Difficulty climbing stairs: {ACTIONS;DENIES/REPORTS:21021675::Denies} Difficulty getting out of chair: {ACTIONS;DENIES/REPORTS:21021675::Denies} Difficulty using hands for taps, buttons, cutlery, and/or writing: {ACTIONS;DENIES/REPORTS:21021675::Denies}  No Rheumatology ROS completed.   PMFS History:  Patient Active Problem List   Diagnosis Date Noted   Rheumatoid arthritis with rheumatoid factor of multiple sites without organ or systems involvement (HCC) 12/25/2024   High risk medication use 12/25/2024   Pre-eclampsia in third trimester 12/25/2024   Family history of rheumatoid arthritis-mother 12/25/2024   Family history of systemic lupus erythematosus-Sister 12/25/2024   Primary hypertension 12/16/2024   Other insomnia 12/16/2024   Anxiety 12/16/2024    Past Medical History:  Diagnosis Date   Abnormal uterine bleeding (AUB) 06/10/2016   BMI 50.0-59.9, adult (HCC)    Bronchitis    Hx   Headache(784.0)    otc meds prn   Hypertension    with 2015 pregnancy    Family History  Problem Relation Age of Onset   Heart failure Mother    Healthy Father    Lupus Sister    GER disease Son    Asthma Daughter    Allergies Daughter    Past Surgical History:  Procedure Laterality Date   CESAREAN SECTION WITH BILATERAL TUBAL LIGATION Bilateral 01/15/2014   Procedure: CESAREAN SECTION WITH BILATERAL TUBAL  LIGATION;  Surgeon: Truman Corona, MD;  Location: [facility];  Service: Obstetrics;  Laterality: Bilateral;  PRIMARY EDC 2/15   DILATATION & CURETTAGE/HYSTEROSCOPY WITH MYOSURE N/A 07/01/2016   Procedure: HYSTEROSCOPY WITH MYOSURE;  Surgeon: Bebe Furry, MD;  Location: [facility];  Service: Gynecology;  Laterality: N/A;   ELBOW SURGERY     right   KNEE ARTHROSCOPY Left    3 surgeries, 2015, 2016, 2024   POLYPECTOMY N/A 07/01/2016   Procedure: POLYPECTOMY;  Surgeon: Bebe Furry, MD;  Location: [facility];  Service: Gynecology;  Laterality: N/A;   TONSILLECTOMY     WISDOM TOOTH EXTRACTION  09/28/2016   Social History[1] Social History   Social History Narrative   Not on file     There is no immunization history for the selected administration types on file for this patient.   Objective: Vital Signs: There were no vitals taken for this visit.   Physical Exam   Musculoskeletal Exam: ***  CDAI Exam: CDAI Score: -- Patient Global: --; Provider Global: -- Swollen: --; Tender: -- Joint Exam 01/21/2025   No joint exam has been documented for this visit   There is currently no information documented on the homunculus. Go to the Rheumatology activity and complete the homunculus joint exam.  Investigation: No additional findings.  Imaging: XR Foot 2 Views Left Result Date: 12/25/2024 First MTP narrowing and subluxation was noted.  PIP and DIP narrowing was noted.  No intertarsal, tibiotalar or subtalar joint space narrowing was noted.  Inferior calcaneal spur was noted.  No erosive changes  were noted. 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  XR Foot 2 Views Right Result Date: 12/25/2024 First MTP narrowing and subluxation was noted.  PIP and DIP narrowing was noted.  No intertarsal, tibiotalar or subtalar joint space narrowing was noted.  Inferior calcaneal spur was noted.  No erosive changes were noted. Impression: These findings are suggestive of  osteoarthritis of the foot.  XR KNEE 3 VIEW LEFT Result Date: 12/25/2024 Severe medial and lateral compartment narrowing was noted.  Moderate patellofemoral narrowing was noted. 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  XR KNEE 3 VIEW RIGHT Result Date: 12/25/2024 Severe lateral compartment narrowing was noted.  Mild patellofemoral narrowing was noted.  Prominence of tibial tuberosity was noted. 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  XR Hand 2 View Left Result Date: 12/25/2024 No MCP, PIP, DIP, intercarpal or or radiocarpal joint space narrowing was noted.  No erosive changes were noted. Impression: Unremarkable x-rays of the hand.  XR Hand 2 View Right Result Date: 12/25/2024 No MCP, PIP, DIP, intercarpal or or radiocarpal joint space narrowing was noted.  No erosive changes were noted. Impression: Unremarkable x-rays of the hand.   Recent Labs: Lab Results  Component Value Date   WBC 5.6 12/25/2024   HGB 14.3 12/25/2024   PLT 298 12/25/2024   NA 138 12/25/2024   K 3.4 (L) 12/25/2024   CL 101 12/25/2024   CO2 27 12/25/2024   GLUCOSE 73 12/25/2024   BUN 13 12/25/2024   CREATININE 0.71 12/25/2024   BILITOT 0.5 12/25/2024   ALKPHOS 105 06/24/2016   AST 9 (L) 12/25/2024   ALT 8 12/25/2024   PROT 7.4 12/25/2024   PROT 7.7 12/25/2024   ALBUMIN 4.0 06/24/2016   CALCIUM 9.8 12/25/2024   GFRAA >60 06/24/2016   QFTBGOLDPLUS NEGATIVE 12/25/2024   December 25, 2024 IFE normal, TPMT normal, hepatitis B&C nonreactive, TB Gold negative, immunoglobulin IgA mildly elevated IgM mildly elevated, urine protein creatinine ratio normal, ANA 1: 640 cytoplasmic and speckled, ENA (RNP, Smith, SSA, SSB, dsDNA, SCL 70) negative, C3-C4 normal, anticardiolipin negative, beta-2  GP 1 negative, lupus anticoagulant negative, sed rate 22, CK 38  Speciality Comments: No specialty comments  available.  Procedures:  No procedures performed Allergies: Percocet [oxycodone-acetaminophen ]   Assessment / Plan:     Visit Diagnoses: No diagnosis found.  Orders: No orders of the defined types were placed in this encounter.  No orders of the defined types were placed in this encounter.   Face-to-face time spent with patient was *** minutes. Greater than 50% of time was spent in counseling and coordination of care.  Follow-Up Instructions: No follow-ups on file.   Maya Nash, MD  Note - This record has been created using Animal nutritionist.  Chart creation errors have been sought, but may not always  have been located. Such creation errors do not reflect on  the standard of medical care.    [1]  Social History Tobacco Use   Smoking status: Never    Passive exposure: Current   Smokeless tobacco: Never  Vaping Use   Vaping status: Never Used  Substance Use Topics   Alcohol use: No   Drug use: No   "

## 2025-01-21 ENCOUNTER — Ambulatory Visit: Admitting: Rheumatology

## 2025-01-21 DIAGNOSIS — F419 Anxiety disorder, unspecified: Secondary | ICD-10-CM

## 2025-01-21 DIAGNOSIS — M0579 Rheumatoid arthritis with rheumatoid factor of multiple sites without organ or systems involvement: Secondary | ICD-10-CM

## 2025-01-21 DIAGNOSIS — G8929 Other chronic pain: Secondary | ICD-10-CM

## 2025-01-21 DIAGNOSIS — M79671 Pain in right foot: Secondary | ICD-10-CM

## 2025-01-21 DIAGNOSIS — I10 Essential (primary) hypertension: Secondary | ICD-10-CM

## 2025-01-21 DIAGNOSIS — M79641 Pain in right hand: Secondary | ICD-10-CM

## 2025-01-21 DIAGNOSIS — O1493 Unspecified pre-eclampsia, third trimester: Secondary | ICD-10-CM

## 2025-01-21 DIAGNOSIS — Z79899 Other long term (current) drug therapy: Secondary | ICD-10-CM

## 2025-01-21 DIAGNOSIS — Z8269 Family history of other diseases of the musculoskeletal system and connective tissue: Secondary | ICD-10-CM

## 2025-01-21 DIAGNOSIS — Z8261 Family history of arthritis: Secondary | ICD-10-CM

## 2025-01-21 DIAGNOSIS — M24521 Contracture, right elbow: Secondary | ICD-10-CM

## 2025-01-21 DIAGNOSIS — G4709 Other insomnia: Secondary | ICD-10-CM
